# Patient Record
Sex: Female | Born: 1985 | Hispanic: Yes | Marital: Married | Smoking: Never smoker
Health system: Southern US, Community
[De-identification: ages and names within clinical notes are randomized; demographics above are authoritative.]

## PROBLEM LIST (undated history)

## (undated) DIAGNOSIS — Z789 Other specified health status: Secondary | ICD-10-CM

## (undated) HISTORY — DX: Other specified health status: Z78.9

## (undated) HISTORY — PX: APPENDECTOMY: SHX54

---

## 2012-06-09 LAB — OB RESULTS CONSOLE HEPATITIS B SURFACE ANTIGEN: Hepatitis B Surface Ag: NEGATIVE

## 2012-06-09 LAB — OB RESULTS CONSOLE HGB/HCT, BLOOD
HCT: 39 %
Hemoglobin: 13.5 g/dL

## 2012-06-09 LAB — OB RESULTS CONSOLE ABO/RH

## 2012-06-09 LAB — OB RESULTS CONSOLE GC/CHLAMYDIA
Chlamydia: NEGATIVE
Gonorrhea: NEGATIVE

## 2012-06-09 LAB — CYSTIC FIBROSIS DIAGNOSTIC STUDY: Interpretation-CFDNA:: NEGATIVE

## 2012-06-24 ENCOUNTER — Other Ambulatory Visit (HOSPITAL_COMMUNITY)
Admission: RE | Admit: 2012-06-24 | Discharge: 2012-06-24 | Disposition: A | Discharge status: Home / Self Care | Payer: Medicaid Other | Source: Ambulatory Visit | Attending: Obstetrics & Gynecology | Admitting: Obstetrics & Gynecology

## 2012-06-24 DIAGNOSIS — Z01419 Encounter for gynecological examination (general) (routine) without abnormal findings: Secondary | ICD-10-CM | POA: Insufficient documentation

## 2012-06-24 DIAGNOSIS — Z1151 Encounter for screening for human papillomavirus (HPV): Secondary | ICD-10-CM | POA: Insufficient documentation

## 2012-08-09 ENCOUNTER — Ambulatory Visit (INDEPENDENT_AMBULATORY_CARE_PROVIDER_SITE_OTHER): Payer: Self-pay | Admitting: Family Medicine

## 2012-08-09 ENCOUNTER — Encounter: Payer: Self-pay | Admitting: Family Medicine

## 2012-08-09 VITALS — BP 112/78 | Temp 97.8°F | Ht 62.0 in | Wt 127.6 lb

## 2012-08-09 DIAGNOSIS — Z3400 Encounter for supervision of normal first pregnancy, unspecified trimester: Secondary | ICD-10-CM

## 2012-08-09 DIAGNOSIS — Z23 Encounter for immunization: Secondary | ICD-10-CM

## 2012-08-09 LAB — POCT URINALYSIS DIP (DEVICE)
Bilirubin Urine: NEGATIVE
Nitrite: NEGATIVE
pH: 7 (ref 5.0–8.0)

## 2012-08-09 MED ORDER — INFLUENZA VIRUS VACC SPLIT PF IM SUSP
0.5000 mL | Freq: Once | INTRAMUSCULAR | Status: AC
  Administered 2012-08-09: 0.5 mL via INTRAMUSCULAR

## 2012-08-09 NOTE — Progress Notes (Signed)
Pulse- 102 Weight discussed of 25-35lbs New ob packet given Pt c/o right side leg pain at the thigh

## 2012-08-09 NOTE — Patient Instructions (Signed)
Embarazo - Segundo trimestre (Pregnancy - Second Trimester) El segundo trimestre del Psychiatrist (del 3 al ) es un perodo de evolucin rpida para usted y el beb. Hacia el final del sexto mes, el beb mide aproximadamente 23 cm y pesa 680 g. Comenzar a Pharmacologist del beb [place] 18 y las 20 [address] de [place]. Podr sentir las pataditas ("quickening en ingls"). Hay un rpido [employer]. Puede segregar un lquido claro Charity fundraiser) de las [place]. Quizs sienta pequeas contracciones en el vientre (tero) Esto se conoce como falso trabajo de parto o contracciones de Braxton-Hicks. Es como una prctica del trabajo de parto que se produce cuando el beb est listo para salir. Generalmente los problemas de vmitos matinales ya se han superado hacia el final del Medical sales representative. Algunas mujeres desarrollan pequeas manchas oscuras (que se denominan cloasma, mscara del embarazo) en la cara que normalmente se van luego del nacimiento del beb. La exposicin al sol empeora las manchas. Puede desarrollarse acn en algunas mujeres embarazadas, y puede desaparecer en aquellas que ya tienen acn. EXAMENES PRENATALES  Durante los Manpower Inc, deber seguir realizando pruebas de [place], segn avance el [place]. Estas pruebas se realizan para controlar su salud y la del beb. Tambin se realizan anlisis de sangre para [employer] Mutual niveles de [place]. La anemia (bajo nivel de hemoglobina) es frecuente durante el embarazo. Para prevenirla, se administran hierro y vitaminas. Tambin se le realizarn exmenes para saber si tiene diabetes entre las 24 y las 28 semanas del [place]. Podrn repetirle algunas de las Hovnanian Enterprises hicieron previamente.  En cada visita le medirn el tamao del tero. Esto se realiza para asegurarse de que el beb est creciendo correctamente de acuerdo al estado del [place].  Tambin en cada visita prenatal controlarn su presin arterial. Esto se realiza  para asegurarse de que no tenga toxemia.  Se controlar su orina para asegurarse de que no tenga infecciones, diabetes o protena en la orina.  Se controlar su peso regularmente para asegurarse que el aumento ocurre al ritmo indicado. Esto se hace para asegurarse que usted y el beb tienen una evolucin normal.  En algunas ocasiones se realiza una prueba de ultrasonido para confirmar el correcto desarrollo y evolucin del beb. Esta prueba se realiza con ondas sonoras inofensivas para el beb, de modo que el profesional pueda calcular ms precisamente la fecha del [place]. Algunas veces se realizan pruebas especializadas del lquido amnitico que rodea al beb. Esta prueba se denomina amniocentesis. El lquido amnitico se obtiene introduciendo una aguja en el vientre (abdomen). Se realiza para Conservator, museum/gallery en los que existe alguna preocupacin acerca de algn problema gentico que pueda sufrir el beb. En ocasiones se lleva a cabo cerca del final del embarazo, si es necesario inducir al Apple Computer. En este caso se realiza para asegurarse que los pulmones del beb estn lo suficientemente maduros como para que pueda vivir fuera del tero. CAMBIOS QUE OCURREN EN EL SEGUNDO TRIMESTRE DEL EMBARAZO Su organismo atravesar numerosos cambios durante el Big Lots. Estos pueden variar de Neomia Dear persona a otra. Converse con el profesional que la asiste acerca los cambios que usted note y que la preocupen.  Durante el segundo trimestre probablemente sienta un aumento del apetito. Es normal tener "antojos" de Development worker, community. Esto vara de Neomia Dear persona a otra y de un embarazo a Therapist, art.  El abdomen inferior comenzar a abultarse.  Podr tener la necesidad de Geographical information systems officer con ms frecuencia debido a que  el tero y el beb presionan sobre la vejiga. Tambin es frecuente contraer ms infecciones urinarias durante el embarazo (dolor al ConocoPhillips). Puede evitarlas bebiendo gran cantidad de lquidos y vaciando  la vejiga antes y despus de Sales promotion account executive.  Podrn aparecer las primeras estras en las caderas, abdomen y [place]. Estos son cambios normales del cuerpo durante el [place]. No existen medicamentos ni ejercicios que puedan prevenir CarMax.  Es posible que comience a desarrollar venas inflamadas y abultadas (varices) en las piernas. El uso de medias de descanso, Optometrist sus pies durante 15 minutos, 3 a 4 veces al da y Film/video editor la sal en su dieta ayuda a Journalist, newspaper.  Podr sentir Engineering geologist gstrica a medida que el tero crece y Doctor, general practice. Puede tomar anticidos, con la autorizacin de su mdico, para Financial planner. Tambin es til ingerir pequeas comidas 4 a 5 veces al Futures trader.  La constipacin puede tratarse con un laxante o agregando fibra a su dieta. Beber grandes cantidades de lquidos, comer vegetales, frutas y granos integrales es de [place].  Tambin es beneficioso practicar actividad fsica. Si ha sido una persona Engineer, mining, podr continuar con la Harley-Davidson de las actividades durante el mismo. Si ha sido [employer] Family [employer], puede ser beneficioso que comience con un programa de ejercicios, Museum/gallery exhibitions officer.  Puede desarrollar hemorroides (vrices en el recto) hacia el final del segundo trimestre. Tomar baos de asiento tibios y Chemical engineer cremas recomendadas por el profesional que lo asiste sern de ayuda para los problemas de hemorroides.  Tambin podr Financial risk analyst de espalda durante este momento de su embarazo. Evite levantar objetos pesados, utilice zapatos de taco bajo y [place] buena postura para ayudar a reducir los problemas de [place].  Algunas mujeres embarazadas desarrollan hormigueo y adormecimiento de la mano y los dedos debido a la hinchazn y compresin de los ligamentos de la mueca (sndrome del tnel carpiano). Esto desaparece una vez que el beb nace.  Como sus pechos se agrandan, Pension scheme manager un sujetador  ms grande. Use un sostn de soporte, cmodo y de algodn. No utilice un sostn para amamantar hasta el ltimo mes de embarazo si va a amamantar al beb.  Podr observar una lnea oscura desde el ombligo hacia la zona pbica denominada linea nigra.  Podr observar que sus mejillas se ponen coloradas debido al aumento de flujo sanguneo en la cara.  Podr desarrollar "araitas" en la cara, cuello y pecho. Esto desaparece una vez que el beb nace. INSTRUCCIONES PARA EL CUIDADO DOMICILIARIO  Es extremadamente importante que evite el cigarrillo, hierbas medicinales, alcohol y las drogas no prescriptas durante el Psychiatrist. Estas sustancias qumicas afectan la formacin y el desarrollo del beb. Evite estas sustancias durante todo el embarazo para asegurar el nacimiento de un beb sano.  La mayor parte de los cuidados que se aconsejan son los mismos que los indicados para Financial risk analyst trimestre del Psychiatrist. Cumpla con las citas tal como se le indic. Siga las instrucciones del profesional que lo asiste con respecto al uso de los medicamentos, el ejercicio y Psychologist, forensic.  Durante el embarazo debe obtener nutrientes para usted y para su beb. Consuma alimentos balanceados a intervalos regulares. Elija alimentos como carne, pescado, [place] y otros productos lcteos descremados, vegetales, frutas, panes integrales y cereales. El Equities trader cul es el aumento de peso ideal.  Las relaciones sexuales fsicas pueden continuarse hasta cerca del fin del embarazo si no existen otros problemas. Estos  problemas pueden ser la prdida temprana (prematura) de lquido amnitico de las [place], sangrado vaginal, dolor abdominal u otros problemas mdicos o del Psychiatrist.  Realice Tesoro Corporation, si no tiene restricciones. Consulte con el profesional que la asiste si no sabe con certeza si determinados ejercicios son seguros. El mayor aumento de peso tiene Environmental consultant durante los ltimos 2 trimestres del  Psychiatrist. El ejercicio la ayudar a:  Engineering geologist.  Ponerla en forma para el parto.  Ayudarla a perder peso luego de haber dado a luz.  Use un buen sostn o como los que se usan para hacer deportes para Paramedic la sensibilidad de las [place]. Tambin puede serle til si lo [place] mientras duerme. Si pierde Product manager, podr Parker Hannifin.  No utilice la baera con agua caliente, baos turcos y saunas durante el [address]  Utilice el cinturn de seguridad sin excepcin cuando conduzca. Este la proteger a usted y al beb en caso de accidente.  Evite comer carne cruda, queso crudo, y el contacto con los utensilios y desperdicios de los gatos. Estos elementos contienen grmenes que pueden causar defectos de nacimiento en el beb.  El segundo trimestre es un buen momento para visitar a su dentista y Software engineer si an no lo ha hecho. Es Primary school teacher los dientes limpios. Utilice un cepillo de dientes blando. Cepllese ms suavemente durante el embarazo.  Es ms fcil perder algo de orina durante el [place]. Apretar y Chief Operating Officer los msculos de la pelvis la ayudar con este problema. Practique detener la miccin cuando est en el bao. Estos son los mismos msculos que Development worker, international aid. Son TEPPCO Partners mismos msculos que utiliza cuando trata de Ryder System gases. Puede practicar apretando estos msculos 10 veces, y repetir esto 3 veces por da aproximadamente. Una vez que conozca qu msculos debe apretar, no realice estos ejercicios durante la miccin. Puede favorecerle una infeccin si la orina vuelve hacia atrs.  Pida ayuda si tiene necesidades econmicas, de asesoramiento o nutricionales durante el [place]. El profesional podr ayudarla con respecto a estas necesidades, o derivarla a otros especialistas.  La piel puede ponerse grasa. Si esto sucede, lvese la cara con un jabn [place], utilice un humectante no graso y [place] con base de aceite o  crema. CONSUMO DE MEDICAMENTOS Y DROGAS DURANTE EL EMBARAZO  Contine tomando las vitaminas apropiadas para esta etapa tal como se le indic. Las vitaminas deben contener un miligramo de cido flico y deben suplementarse con hierro. Guarde todas las vitaminas fuera del alcance de los nios. La ingestin de slo un par de vitaminas o tabletas que contengan hierro puede ocasionar la Newmont Mining en un beb o en un nio pequeo.  Evite el uso de [place], inclusive los de venta libre y hierbas que no hayan sido prescriptos o indicados por el profesional que la asiste. Algunos medicamentos pueden causar problemas fsicos al beb. Utilice los medicamentos de venta libre o de prescripcin para Chief Technology Officer, Environmental health practitioner o la [place], segn se lo indique el profesional que lo asiste. No utilice aspirina.  El consumo de alcohol est relacionado con ciertos defectos de nacimiento. Esto incluye el sndrome de alcoholismo fetal. Debe evitar el consumo de alcohol en cualquiera de sus formas. El cigarrillo causa nacimientos prematuros y bebs de [place]. El uso de drogas recreativas est absolutamente prohibido. Son muy nocivas para el beb. Un beb que nace de American Express, ser adicto al nacer. Ese beb tendr los mismos  sntomas de abstinencia que un adulto.  Infrmele al profesional si consume alguna droga.  No consuma drogas ilegales. Pueden causarle mucho dao al beb. SOLICITE ATENCIN MDICA SI: Tiene preguntas o preocupaciones durante su embarazo. Es mejor que llame para Science writer las dudas que esperar hasta su prxima visita prenatal. Thressa Sheller forma se sentir ms tranquila.  SOLICITE ATENCIN MDICA DE INMEDIATO SI:  La temperatura oral se eleva sin motivo por encima de 102 F (38.9 C) o segn le indique el profesional que lo asiste.  Tiene una prdida de lquido por la vagina (canal de parto). Si sospecha una ruptura de las [place], tmese la temperatura y llame al profesional para informarlo sobre  esto.  Observa unas pequeas manchas, una hemorragia vaginal o elimina cogulos. Notifique al profesional acerca de la cantidad y de cuntos apsitos est utilizando. Unas pequeas manchas de sangre son algo comn durante el Psychiatrist, especialmente despus de Sales promotion account executive.  Presenta un olor desagradable en la secrecin vaginal y observa un cambio en el color, de transparente a blanco.  Contina con las nuseas y no obtiene alivio de los remedios indicados. Vomita sangre o algo similar a la borra del caf.  [place] o sube ms de 900 g. en una semana, o segn lo indicado por el profesional que la asiste.  Observa que se le Southwest Airlines, las manos, los pies o las piernas.  Ha estado expuesta a la rubola y no ha sufrido la enfermedad.  Ha estado expuesta a la quinta enfermedad o a la varicela.  Presenta dolor abdominal. Las molestias en el ligamento redondo son Neomia Dear causa no cancerosa (benigna) frecuente de dolor abdominal durante el embarazo. El profesional que la asiste deber evaluarla.  Presenta dolor de cabeza intenso que no se [place].  Presenta fiebre, diarrea, dolor al orinar o le falta la respiracin.  Presenta dificultad para ver, visin borrosa, o visin doble.  Sufre una cada, un accidente de trnsito o cualquier tipo de trauma.  Vive en un hogar en el que existe violencia fsica o mental. Document Released: 05/07/2005 Document Revised: 10/20/2011 [facility] Patient Information 2013 [place], [place].  Lactancia materna  (Breastfeeding) Decidir Museum/gallery exhibitions officer es una de las mejores elecciones que puede hacer por usted y su beb. La informacin que se brinda a Psychologist, clinical dar una breve visin de los beneficios de la lactancia materna as como de las dudas ms frecuentes alrededor de ella.  LOS BENEFICIOS DE AMAMANTAR  Para el beb   La primera leche (calostro ) ayuda al mejor funcionamiento del sistema digestivo del beb.   La leche tiene anticuerpos que  provienen de la madre y que ayudan a prevenir las infecciones en el beb.   El beb tiene una menor incidencia de asma, alergias y del sndrome de muerte sbita del lactante (SMSL).   Los nutrientes en la [place] materna son mejores para el beb que los preparados para lactantes y la [place] materna ayuda a un mejor desarrollo del cerebro del beb.   Los bebs amamantados tienen menos gases, clicos y estreimiento.  Para la mam   La lactancia materna favorece el desarrollo de un vnculo muy especial entre la madre y el beb.   Es ms conveniente, siempre disponible y a Landscape architect y Film/video editor.   Consume caloras en la madre y la ayuda a perder el peso ganado durante el [place].   Favorece la contraccin del tero a su tamao normal, de manera ms rpida y [employer]away las hemorragias luego del  parto.   Las [employer].C. Holdings que amamantan tienen menor riesgo de Geophysical data processor de mama.  FRECUENCIA DEL AMAMANTAMIENTO   Un beb sano, nacido a trmino, puede amamantarse con tanta frecuencia como cada hora, o espaciar las comidas cada tres horas.   Observe al beb cuando manifieste signos de hambre. Amamante a su beb si muestra signos de hambre. Esta frecuencia variar de un beb a otro.   Amamntelo tan seguido como el beb lo solicite, o cuando usted sienta la necesidad de Paramedic sus [place].   Despierte al beb si han pasado 3  4 horas desde la ltima comida.   El amamantamiento frecuente la ayudar a producir ms [place] y a Education officer, community de Engineer, mining en los pezones e hinchazn de las [place].  POSICIN DEL BEBE PARA EL AMAMANTAMIENTO   Ya sea que se encuentre [place] o sentada, asegrese que el abdomen del beb enfrente el suyo.   Sostenga la mama con el pulgar por arriba y los otros 4 dedos por debajo. Asegrese que sus dedos se encuentren lejos del pezn y de la boca del beb.   Empuje suavemente los labios del beb con el pezn o con el dedo.   Cuando la boca  del beb se abra lo suficiente, introduzca el pezn y la areola tanto como le sea posible dentro de la boca.   Coloque al beb cerca suyo de modo que su nariz y mejillas toquen las mamas al Texas [employer].  ALIMENTACIN Y SUCCIN   La duracin de cada comida vara de un beb a otro y de Neomia Dear comida a [place].   El beb debe succionar entre 2 y 3 minutos para que Development worker, international aid. Esto se denomina "bajada". Por este motivo, permita que el nio se alimente en cada mama todo lo que desee. Terminar de mamar cuando haya recibido la cantidad [place] de nutrientes.   Para detener la succin coloque su dedo en la comisura de la boca del nio y Midwife entre sus encas antes de quitarle la mama de la boca. Esto la ayudar a English as a second language teacher.  COMO SABER SI EL BEB OBTIENE LA SUFICIENTE LECHE MATERNA  Preguntarse si el beb obtiene la cantidad suficiente de [place] es una preocupacin frecuente Lucent Technologies. Puede asegurarse que el beb tiene la leche suficiente si:   El beb succiona activamente y usted escucha que traga .   El beb parece estar relajado y satisfecho despus de Psychologist, clinical.   El nio se alimenta al menos 8 a 12 veces en 24 horas. Alimntelo hasta que se desprenda por sus propios medios o se quede dormido en la primera mama (al menos durante 10 a 20 minutos), luego ofrzcale el otro lado.   El beb moja 5 a 6 paales desechables (6 a 8 paales de tela) en 24 horas cuando tiene 5  6 das de vida.   Tiene al Lowe's Companies 3 a 4 deposiciones todos los Becton, Dickinson and Company primeros meses. La materia fecal debe ser blanda y [place].   El beb debe aumentar 4 a 6 libras (120 a 170 gr.) por semana despus de los 4 [address] de vida.   Siente que las mamas se ablandan despus de amamantar  REDUCIR LA CONGESTIN DE LAS MAMAS   Durante la primera semana despus del [place], usted puede experimentar hinchazn en las mamas. Cuando las mamas estn congestionadas, se sienten calientes, llenas y  molestas al tacto. Puede reducir la congestin si:   Lo amamanta frecuentemente, cada 2-3  horas. Las mams que [employer] Corporation pronto y con frecuencia tienen menos problemas de [place].   Coloque compresas de hielo en sus mamas durante 10-20 minutos entre cada amamantamiento. Esto ayuda a Building services engineer. Envuelva las bolsas de hielo en una toalla liviana para proteger su piel. Las bolsas de vegetales congelados funcionan bien para este propsito.   Tome una ducha tibia o aplique compresas hmedas calientes en las mamas durante 5 a 10 minutos antes de cada vez que [place]. Esto aumenta la circulacin y [place] a que la [place].   Masajee suavemente la mama antes y Psychologist, sport and exercise. Con las puntas de los dedos, masajee desde la pared torcica hacia abajo hasta llegar al pezn, con movimientos circulares.   Asegrese que el nio vaca al menos una mama antes de cambiar de lado.   Use un sacaleche para vaciar la mama si el beb se duerme o no se alimenta bien. Tambin podr Phelps Dodge con esa bomba si tiene que volver al trabajo o siente que las mamas estn congestionadas.   Evite los biberones, chupetes o complementar la alimentacin con agua o jugos en lugar de la [place]. La leche materna es todo el alimento que el beb necesita. No es necesario que el nio ingiera agua o preparados de bibern. Louann Liv, es lo mejor para ayudar a que las mamas produzcan ms [place]. no darle suplementos al Bank of America las primeras semanas.   Verifique que el beb se encuentra en la posicin correcta mientras lo alimenta.   Use un sostn que soporte bien sus mamas y State Street Corporation que tienen aro.   Consuma una dieta balanceada y beba lquidos en cantidad.   Descanse con frecuencia, reljese y tome sus vitaminas prenatales para evitar la fatiga, el estrs y la anemia.  Si sigue estas indicaciones, la congestin debe mejorar en 24 a 48 horas. Si an tiene dificultades, consulte a Nurse, adult.  CUDESE USTED MISMA  Cuide sus mamas.   Bese o dchese diariamente.   Evite usar Eaton Corporation.   Comience a amamantar del lado izquierdo en una comida y del lado derecho en la siguiente.   Notar que aumenta el flujo de [place] a los 2 a 5 [address] despus del [address]. Puede sentir algunas molestias por la congestin, lo que hace que sus mamas estn duras y sensibles. La congestin disminuye en 24 a 48 horas. Mientras tanto, aplique toallas hmedas calientes durante 5 a 10 minutos antes de amamantar. Un masaje suave y la extraccin de un poco de leche antes de Museum/gallery exhibitions officer ablandarn las mamas y har ms fcil que el beb se agarre.   Use un buen sostn y seque al aire los pezones durante 3 a 4 minutos luego de Wellsite geologist.   Solo utilice apsitos de algodn.   Utilice lanolina W[employer] International pezones luego de [place]. No necesita lavarlos luego de alimentar al McGraw[employer]Hill. Otra opcin es exprimir algunas gotas de [place] y [employer] Holdings pezones.  Cumpla con estos cuidados   Consuma alimentos bien balanceados y refrigerios nutritivos.   Dixie Dials, jugos de fruta y agua para Warehouse manager sed (alrededor de 8 vasos por Futures trader).   Descanse lo suficiente.  Evite los alimentos que usted note que pueden afectar al beb.  SOLICITE ATENCIN MDICA SI:   Tiene dificultad con la lactancia materna y [place].   Tiene una zona de color rojo, dura y dolorosa en la mama que se acompaa  de fiebre.   El beb est muy somnoliento como para alimentarse bien o tiene problemas para dormir.   Su beb moja menos de 6 paales al [address], a los 5 [address]  [address] de vida.   La piel del beb o la parte blanca de sus ojos est ms amarilla de lo que estaba en el hospital.   Se siente deprimida.  Document Released: 07/28/2005 Document Revised: 01/27/2012 [facility] Patient Information 2013 [place], [place].

## 2012-08-09 NOTE — Progress Notes (Signed)
New OB transfer from [facility] secondary to money. Needs integrated screen part II-done and WNL-nml NT Anatomy-will send to Dr. Elsie Stain office.

## 2012-08-09 NOTE — Progress Notes (Signed)
Nutrition note: 1st visit consult Pt has gained 14.6# @ [redacted]w[redacted]d, which is slightly > expected. Pt reports eating 3 meals & 1-2 snacks/d. Pt reports drinking water, milk & juice daily and coke occ. Pt is taking PNV. Pt reports no more N&V but does have heartburn. NKFA. Pt reports walking 3-4x/wk. Pt received verbal & written education on general nutrition during pregnancy & tips to decrease heartburn. Disc benefits of exclusive BF. Disc wt gain goals of 25-35# or 1#/wk. Pt agrees to continue PNV. Pt receives WIC in [place]. Pt plans to BF. F/u if referred Blondell Reveal, MS, RD, LDN

## 2012-08-11 NOTE — L&D Delivery Note (Signed)
Jema Vannesa Abair is a 27 y.o. [id number] presenting at [redacted]w[redacted]d with PPROM.   Delivery Note At 3:58 AM a viable female was delivered via Vaginal, Spontaneous Delivery (Presentation: Left Occiput Anterior).  APGAR: 7, 9; weight 7 lb 7.6 oz (3391 g).   Placenta status: Intact, Spontaneous.  Cord: 3 vessels with the following complications: None.  Cord pH: n/a  Anesthesia: None  Episiotomy: None Lacerations: None Suture Repair: n/a Est. Blood Loss (mL): 300  Mom to postpartum.  Baby to nursery-stable.  Napoleon Form 12/14/2012, 4:46 AM

## 2012-08-12 ENCOUNTER — Encounter: Payer: Self-pay | Admitting: *Deleted

## 2012-08-16 ENCOUNTER — Ambulatory Visit (HOSPITAL_COMMUNITY)
Admission: RE | Admit: 2012-08-16 | Discharge: 2012-08-16 | Disposition: A | Discharge status: Home / Self Care | Payer: Self-pay | Source: Ambulatory Visit | Attending: Family Medicine | Admitting: Family Medicine

## 2012-08-16 ENCOUNTER — Encounter: Payer: Self-pay | Admitting: *Deleted

## 2012-08-16 DIAGNOSIS — Z3400 Encounter for supervision of normal first pregnancy, unspecified trimester: Secondary | ICD-10-CM

## 2012-08-16 DIAGNOSIS — O358XX0 Maternal care for other (suspected) fetal abnormality and damage, not applicable or unspecified: Secondary | ICD-10-CM | POA: Insufficient documentation

## 2012-08-16 DIAGNOSIS — Z363 Encounter for antenatal screening for malformations: Secondary | ICD-10-CM | POA: Insufficient documentation

## 2012-08-16 DIAGNOSIS — Z1389 Encounter for screening for other disorder: Secondary | ICD-10-CM | POA: Insufficient documentation

## 2012-08-17 ENCOUNTER — Encounter: Payer: Self-pay | Admitting: Family Medicine

## 2012-08-17 ENCOUNTER — Encounter: Payer: Self-pay | Admitting: *Deleted

## 2012-09-08 ENCOUNTER — Ambulatory Visit (INDEPENDENT_AMBULATORY_CARE_PROVIDER_SITE_OTHER): Payer: Self-pay | Admitting: Family Medicine

## 2012-09-08 VITALS — BP 112/70 | Temp 97.1°F | Wt 137.8 lb

## 2012-09-08 DIAGNOSIS — Z3400 Encounter for supervision of normal first pregnancy, unspecified trimester: Secondary | ICD-10-CM

## 2012-09-08 LAB — POCT URINALYSIS DIP (DEVICE)
Bilirubin Urine: NEGATIVE
Glucose, UA: NEGATIVE mg/dL
Hgb urine dipstick: NEGATIVE
Nitrite: NEGATIVE
Specific Gravity, Urine: 1.02 (ref 1.005–1.030)
Urobilinogen, UA: 0.2 mg/dL (ref 0.0–1.0)

## 2012-09-08 NOTE — Progress Notes (Signed)
Concerned about poor weight gain previously, but has gained 10 lb since last visit. Discussed appropriate diet/nutrition and weight gain.

## 2012-09-08 NOTE — Patient Instructions (Signed)
Embarazo - Segundo trimestre (Pregnancy - Second Trimester) El segundo trimestre del embarazo (del 3 al 6mes) es un perodo de evolucin rpida para usted y el beb. Hacia el final del sexto mes, el beb mide aproximadamente 23 cm y pesa 680 g. Comenzar a sentir los movimientos del beb entre las 18 y las 20 semanas de embarazo. Podr sentir las pataditas ("quickening en ingls"). Hay un rpido aumento de peso. Puede segregar un lquido claro (calostro) de las mamas. Quizs sienta pequeas contracciones en el vientre (tero) Esto se conoce como falso trabajo de parto o contracciones de Braxton-Hicks. Es como una prctica del trabajo de parto que se produce cuando el beb est listo para salir. Generalmente los problemas de vmitos matinales ya se han superado hacia el final del primer trimestre. Algunas mujeres desarrollan pequeas manchas oscuras (que se denominan cloasma, mscara del embarazo) en la cara que normalmente se van luego del nacimiento del beb. La exposicin al sol empeora las manchas. Puede desarrollarse acn en algunas mujeres embarazadas, y puede desaparecer en aquellas que ya tienen acn. EXAMENES PRENATALES  Durante los exmenes prenatales, deber seguir realizando pruebas de sangre, segn avance el embarazo. Estas pruebas se realizan para controlar su salud y la del beb. Tambin se realizan anlisis de sangre para conocer los niveles de hemoglobina. La anemia (bajo nivel de hemoglobina) es frecuente durante el embarazo. Para prevenirla, se administran hierro y vitaminas. Tambin se le realizarn exmenes para saber si tiene diabetes entre las 24 y las 28 semanas del embarazo. Podrn repetirle algunas de las pruebas que le hicieron previamente.  En cada visita le medirn el tamao del tero. Esto se realiza para asegurarse de que el beb est creciendo correctamente de acuerdo al estado del embarazo.  Tambin en cada visita prenatal controlarn su presin arterial. Esto se realiza  para asegurarse de que no tenga toxemia.  Se controlar su orina para asegurarse de que no tenga infecciones, diabetes o protena en la orina.  Se controlar su peso regularmente para asegurarse que el aumento ocurre al ritmo indicado. Esto se hace para asegurarse que usted y el beb tienen una evolucin normal.  En algunas ocasiones se realiza una prueba de ultrasonido para confirmar el correcto desarrollo y evolucin del beb. Esta prueba se realiza con ondas sonoras inofensivas para el beb, de modo que el profesional pueda calcular ms precisamente la fecha del parto. Algunas veces se realizan pruebas especializadas del lquido amnitico que rodea al beb. Esta prueba se denomina amniocentesis. El lquido amnitico se obtiene introduciendo una aguja en el vientre (abdomen). Se realiza para controlar los cromosomas en aquellos casos en los que existe alguna preocupacin acerca de algn problema gentico que pueda sufrir el beb. En ocasiones se lleva a cabo cerca del final del embarazo, si es necesario inducir al parto. En este caso se realiza para asegurarse que los pulmones del beb estn lo suficientemente maduros como para que pueda vivir fuera del tero. CAMBIOS QUE OCURREN EN EL SEGUNDO TRIMESTRE DEL EMBARAZO Su organismo atravesar numerosos cambios durante el embarazo. Estos pueden variar de una persona a otra. Converse con el profesional que la asiste acerca los cambios que usted note y que la preocupen.  Durante el segundo trimestre probablemente sienta un aumento del apetito. Es normal tener "antojos" de ciertas comidas. Esto vara de una persona a otra y de un embarazo a otro.  El abdomen inferior comenzar a abultarse.  Podr tener la necesidad de orinar con ms frecuencia debido a que   el tero y el beb presionan sobre la vejiga. Tambin es frecuente contraer ms infecciones urinarias durante el embarazo (dolor al orinar). Puede evitarlas bebiendo gran cantidad de lquidos y vaciando  la vejiga antes y despus de mantener relaciones sexuales.  Podrn aparecer las primeras estras en las caderas, abdomen y mamas. Estos son cambios normales del cuerpo durante el embarazo. No existen medicamentos ni ejercicios que puedan prevenir estos cambios.  Es posible que comience a desarrollar venas inflamadas y abultadas (varices) en las piernas. El uso de medias de descanso, elevar sus pies durante 15 minutos, 3 a 4 veces al da y limitar la sal en su dieta ayuda a aliviar el problema.  Podr sentir acidez gstrica a medida que el tero crece y presiona contra el estmago. Puede tomar anticidos, con la autorizacin de su mdico, para aliviar este problema. Tambin es til ingerir pequeas comidas 4 a 5 veces al da.  La constipacin puede tratarse con un laxante o agregando fibra a su dieta. Beber grandes cantidades de lquidos, comer vegetales, frutas y granos integrales es de gran ayuda.  Tambin es beneficioso practicar actividad fsica. Si ha sido una persona activa hasta el embarazo, podr continuar con la mayora de las actividades durante el mismo. Si ha sido menos activa, puede ser beneficioso que comience con un programa de ejercicios, como realizar caminatas.  Puede desarrollar hemorroides (vrices en el recto) hacia el final del segundo trimestre. Tomar baos de asiento tibios y utilizar cremas recomendadas por el profesional que lo asiste sern de ayuda para los problemas de hemorroides.  Tambin podr sentir dolor de espalda durante este momento de su embarazo. Evite levantar objetos pesados, utilice zapatos de taco bajo y mantenga una buena postura para ayudar a reducir los problemas de espalda.  Algunas mujeres embarazadas desarrollan hormigueo y adormecimiento de la mano y los dedos debido a la hinchazn y compresin de los ligamentos de la mueca (sndrome del tnel carpiano). Esto desaparece una vez que el beb nace.  Como sus pechos se agrandan, necesitar un sujetador  ms grande. Use un sostn de soporte, cmodo y de algodn. No utilice un sostn para amamantar hasta el ltimo mes de embarazo si va a amamantar al beb.  Podr observar una lnea oscura desde el ombligo hacia la zona pbica denominada linea nigra.  Podr observar que sus mejillas se ponen coloradas debido al aumento de flujo sanguneo en la cara.  Podr desarrollar "araitas" en la cara, cuello y pecho. Esto desaparece una vez que el beb nace. INSTRUCCIONES PARA EL CUIDADO DOMICILIARIO  Es extremadamente importante que evite el cigarrillo, hierbas medicinales, alcohol y las drogas no prescriptas durante el embarazo. Estas sustancias qumicas afectan la formacin y el desarrollo del beb. Evite estas sustancias durante todo el embarazo para asegurar el nacimiento de un beb sano.  La mayor parte de los cuidados que se aconsejan son los mismos que los indicados para el primer trimestre del embarazo. Cumpla con las citas tal como se le indic. Siga las instrucciones del profesional que lo asiste con respecto al uso de los medicamentos, el ejercicio y la dieta.  Durante el embarazo debe obtener nutrientes para usted y para su beb. Consuma alimentos balanceados a intervalos regulares. Elija alimentos como carne, pescado, leche y otros productos lcteos descremados, vegetales, frutas, panes integrales y cereales. El profesional le informar cul es el aumento de peso ideal.  Las relaciones sexuales fsicas pueden continuarse hasta cerca del fin del embarazo si no existen otros problemas. Estos   problemas pueden ser la prdida temprana (prematura) de lquido amnitico de las membranas, sangrado vaginal, dolor abdominal u otros problemas mdicos o del embarazo.  Realice actividad fsica todos los das, si no tiene restricciones. Consulte con el profesional que la asiste si no sabe con certeza si determinados ejercicios son seguros. El mayor aumento de peso tiene lugar durante los ltimos 2 trimestres del  embarazo. El ejercicio la ayudar a:  Controlar su peso.  Ponerla en forma para el parto.  Ayudarla a perder peso luego de haber dado a luz.  Use un buen sostn o como los que se usan para hacer deportes para aliviar la sensibilidad de las mamas. Tambin puede serle til si lo usa mientras duerme. Si pierde calostro, podr utilizar apsitos en el sostn.  No utilice la baera con agua caliente, baos turcos y saunas durante el embarazo.  Utilice el cinturn de seguridad sin excepcin cuando conduzca. Este la proteger a usted y al beb en caso de accidente.  Evite comer carne cruda, queso crudo, y el contacto con los utensilios y desperdicios de los gatos. Estos elementos contienen grmenes que pueden causar defectos de nacimiento en el beb.  El segundo trimestre es un buen momento para visitar a su dentista y evaluar su salud dental si an no lo ha hecho. Es importante mantener los dientes limpios. Utilice un cepillo de dientes blando. Cepllese ms suavemente durante el embarazo.  Es ms fcil perder algo de orina durante el embarazo. Apretar y fortalecer los msculos de la pelvis la ayudar con este problema. Practique detener la miccin cuando est en el bao. Estos son los mismos msculos que necesita fortalecer. Son tambin los mismos msculos que utiliza cuando trata de evitar los gases. Puede practicar apretando estos msculos 10 veces, y repetir esto 3 veces por da aproximadamente. Una vez que conozca qu msculos debe apretar, no realice estos ejercicios durante la miccin. Puede favorecerle una infeccin si la orina vuelve hacia atrs.  Pida ayuda si tiene necesidades econmicas, de asesoramiento o nutricionales durante el embarazo. El profesional podr ayudarla con respecto a estas necesidades, o derivarla a otros especialistas.  La piel puede ponerse grasa. Si esto sucede, lvese la cara con un jabn suave, utilice un humectante no graso y maquillaje con base de aceite o  crema. CONSUMO DE MEDICAMENTOS Y DROGAS DURANTE EL EMBARAZO  Contine tomando las vitaminas apropiadas para esta etapa tal como se le indic. Las vitaminas deben contener un miligramo de cido flico y deben suplementarse con hierro. Guarde todas las vitaminas fuera del alcance de los nios. La ingestin de slo un par de vitaminas o tabletas que contengan hierro puede ocasionar la muerte en un beb o en un nio pequeo.  Evite el uso de medicamentos, inclusive los de venta libre y hierbas que no hayan sido prescriptos o indicados por el profesional que la asiste. Algunos medicamentos pueden causar problemas fsicos al beb. Utilice los medicamentos de venta libre o de prescripcin para el dolor, el malestar o la fiebre, segn se lo indique el profesional que lo asiste. No utilice aspirina.  El consumo de alcohol est relacionado con ciertos defectos de nacimiento. Esto incluye el sndrome de alcoholismo fetal. Debe evitar el consumo de alcohol en cualquiera de sus formas. El cigarrillo causa nacimientos prematuros y bebs de bajo peso. El uso de drogas recreativas est absolutamente prohibido. Son muy nocivas para el beb. Un beb que nace de una madre adicta, ser adicto al nacer. Ese beb tendr los mismos   sntomas de abstinencia que un adulto.  Infrmele al profesional si consume alguna droga.  No consuma drogas ilegales. Pueden causarle mucho dao al beb. SOLICITE ATENCIN MDICA SI: Tiene preguntas o preocupaciones durante su embarazo. Es mejor que llame para consultar las dudas que esperar hasta su prxima visita prenatal. De esta forma se sentir ms tranquila.  SOLICITE ATENCIN MDICA DE INMEDIATO SI:  La temperatura oral se eleva sin motivo por encima de 102 F (38.9 C) o segn le indique el profesional que lo asiste.  Tiene una prdida de lquido por la vagina (canal de parto). Si sospecha una ruptura de las membranas, tmese la temperatura y llame al profesional para informarlo sobre  esto.  Observa unas pequeas manchas, una hemorragia vaginal o elimina cogulos. Notifique al profesional acerca de la cantidad y de cuntos apsitos est utilizando. Unas pequeas manchas de sangre son algo comn durante el embarazo, especialmente despus de mantener relaciones sexuales.  Presenta un olor desagradable en la secrecin vaginal y observa un cambio en el color, de transparente a blanco.  Contina con las nuseas y no obtiene alivio de los remedios indicados. Vomita sangre o algo similar a la borra del caf.  [place] o sube ms de 900 g. en una semana, o segn lo indicado por el profesional que la asiste.  Observa que se le hinchan el rostro, las manos, los pies o las piernas.  Ha estado expuesta a la rubola y no ha sufrido la enfermedad.  Ha estado expuesta a la quinta enfermedad o a la varicela.  Presenta dolor abdominal. Las molestias en el ligamento redondo son una causa no cancerosa (benigna) frecuente de dolor abdominal durante el embarazo. El profesional que la asiste deber evaluarla.  Presenta dolor de cabeza intenso que no se alivia.  Presenta fiebre, diarrea, dolor al orinar o le falta la respiracin.  Presenta dificultad para ver, visin borrosa, o visin doble.  Sufre una cada, un accidente de trnsito o cualquier tipo de trauma.  Vive en un hogar en el que existe violencia fsica o mental. Document Released: 05/07/2005 Document Revised: 10/20/2011 [employer] Patient Information 2013 [employer].  

## 2012-09-08 NOTE — Progress Notes (Signed)
Pulse: 82

## 2012-10-06 ENCOUNTER — Other Ambulatory Visit: Payer: Self-pay | Admitting: Family Medicine

## 2012-10-06 ENCOUNTER — Encounter: Payer: Self-pay | Admitting: Internal Medicine

## 2012-10-06 ENCOUNTER — Encounter: Payer: Self-pay | Admitting: *Deleted

## 2012-10-06 ENCOUNTER — Ambulatory Visit (INDEPENDENT_AMBULATORY_CARE_PROVIDER_SITE_OTHER): Payer: Self-pay | Admitting: Family Medicine

## 2012-10-06 ENCOUNTER — Encounter: Payer: Self-pay | Admitting: Advanced Practice Midwife

## 2012-10-06 VITALS — BP 115/70 | Wt 145.1 lb

## 2012-10-06 DIAGNOSIS — Z3400 Encounter for supervision of normal first pregnancy, unspecified trimester: Secondary | ICD-10-CM

## 2012-10-06 LAB — POCT URINALYSIS DIP (DEVICE)
Glucose, UA: NEGATIVE mg/dL
Nitrite: NEGATIVE
Protein, ur: NEGATIVE mg/dL
Urobilinogen, UA: 0.2 mg/dL (ref 0.0–1.0)

## 2012-10-06 LAB — CBC
Hemoglobin: 11.9 g/dL — ABNORMAL LOW (ref 12.0–15.0)
Platelets: 248 10*3/uL (ref 150–400)
RBC: 4 MIL/uL (ref 3.87–5.11)
WBC: 8.1 10*3/uL (ref 4.0–10.5)

## 2012-10-06 LAB — RPR

## 2012-10-06 NOTE — Patient Instructions (Signed)
Embarazo  Tercer trimestre  (Pregnancy - Third Trimester) El tercer trimestre del embarazo (los ltimos 3 meses) es el perodo en el cual tanto usted como su beb crecen con ms rapidez. El beb alcanza un largo de aproximadamente 50 cm. y pesa entre 2,700 y 4,500 kg. El beb gana ms tejido graso y est listo para la vida fuera del cuerpo de la madre. Mientras estn en el interior, los bebs tienen perodos de sueo y vigilia, succionan el pulgar y tienen hipo. Quizs sienta pequeas contracciones del tero. Este es el falso trabajo de parto. Tambin se las conoce como contracciones de Braxton-Hicks . Es como una prctica del parto. Los problemas ms habituales de esta etapa del embarazo incluyen mayor dificultad para respirar, hinchazn de las manos y los pies por retencin de lquidos y la necesidad de orinar con ms frecuencia debido a que el tero y el beb presionan sobre la vejiga.  EXAMENES PRENATALES   Durante los exmenes prenatales, deber seguir realizndose anlisis de sangre. Estas pruebas se realizan para controlar su salud y la del beb. Los anlisis de sangre se realizan para conocer los niveles de algunos compuestos de la sangre (hemoglobina). La anemia (bajo nivel de hemoglobina) es frecuente durante el embarazo. Para prevenirla, se administran hierro y vitaminas. Tambin le tomarn nuevas anlisis para descartar diabetes. Podrn repetirle algunas de las pruebas que le hicieron previamente.  En cada visita le medirn el tamao del tero. Esto permite asegurar que el beb se desarrolla adecuadamente, segn la fecha del embarazo.  Le controlarn la presin arterial en cada visita prenatal. Esto es para asegurarse de que no sufre toxemia.  Le harn un anlisis de orina en cada visita prenatal, para descartar infecciones, diabetes y la presencia de protenas.  Tambin en cada visita controlarn su peso. Esto se realiza para asegurarse que aumenta de peso al ritmo indicado y que usted y su  beb evolucionan normalmente.  En algunas ocasiones se realiza una prueba de ultrasonido para confirmar el correcto desarrollo y evolucin del beb. Esta prueba se realiza con ondas sonoras inofensivas para el beb, de modo que el profesional pueda calcular ms precisamente la fecha del parto.  Analice con su mdico los analgsicos y la anestesia que recibir durante el trabajo de parto y el parto.  Comente la posibilidad de que necesite una cesrea y qu anestesia se recibir.  Informe a su mdico si sufre violencia familiar mental o fsica. A veces, se indica la prueba especializada sin estrs, la prueba de tolerancia a las contracciones y el perfil biofsico para asegurarse de que el beb no tiene problemas. El estudio del lquido amnitico que rodea al beb se llama amniocentesis. El lquido amnitico se obtiene introduciendo una aguja en el vientre (abdomen ). En ocasiones se lleva a cabo cerca del final del embarazo, si es necesario inducir a un parto. En este caso se realiza para asegurarse que los pulmones del beb estn lo suficientemente maduros como para que pueda vivir fuera del tero. Si los pulmones no han madurado y es peligroso que el beb nazca, se administrar a la madre una inyeccin de cortisona , 1 a 2 das antes del parto. . Esto ayuda a que los pulmones del beb maduren y sea ms seguro su nacimiento.  CAMBIOS QUE OCURREN EN EL TERCER TRIMESTRE DEL EMBARAZO  Su organismo atravesar numerosos cambios durante el embarazo. Estos pueden variar de una persona a otra. Converse con el profesional que la asiste acerca los cambios que   usted note y que la preocupen.   Durante el ltimo trimestre probablemente sienta un aumento del apetito. Es normal tener "antojos" de ciertas comidas. Esto vara de una persona a otra y de un embarazo a otro.  Podrn aparecer las primeras estras en las caderas, abdomen y mamas. Estos son cambios normales del cuerpo durante el embarazo. No existen  medicamentos ni ejercicios que puedan prevenir estos cambios.  La constipacin puede tratarse con un laxante o agregando fibra a su dieta. Beber grandes cantidades de lquidos, tomar fibras en forma de vegetales, frutas y granos integrales es de gran ayuda.  Tambin es beneficioso practicar actividad fsica. Si ha sido una persona activa hasta el embarazo, podr continuar con la mayora de las actividades durante el mismo. Si ha sido menos activa, puede ser beneficioso que comience con un programa de ejercicios, como realizar caminatas. Consulte con el profesional que la asiste antes de comenzar un programa de ejercicios.  Evite el consumo de cigarrillos, el alcohol, los medicamentos no recetados y las "drogas de la calle" durante el embarazo. Estas sustancias qumicas afectan la formacin y el desarrollo del beb. Evite estas sustancias durante todo el embarazo para asegurar el nacimiento de un beb sano.  Podr sentir dolor de espalda, tener vrices en las venas y hemorroides, o si ya los sufra, pueden empeorar.  Durante el tercer trimestre se cansar con ms facilidad, lo cual es normal.  Los movimientos del beb pueden ser ms fuertes y con ms frecuencia.  Puede que note dificultades para respirar normalmente.  El ombligo puede salir hacia afuera.  A veces sale una secrecin amarilla de las mamas, que se llama calostro.  Podr aparecer una secrecin mucosa con sangre. Esto suele ocurrir entre unos pocos das y una semana antes del parto. INSTRUCCIONES PARA EL CUIDADO EN EL HOGAR   Cumpla con las citas de control. Siga las indicaciones del mdico con respecto al uso de medicamentos, los ejercicios y la dieta.  Durante el embarazo debe obtener nutrientes para usted y para su beb. Consuma alimentos balanceados a intervalos regulares. Elija alimentos como carne, pescado, leche y otros productos lcteos descremados, vegetales, frutas, panes integrales y cereales. El mdico le informar  cul es el aumento de peso ideal.  Las relaciones sexuales pueden continuarse hasta casi el final del embarazo, si no se presentan otros problemas como prdida prematura (antes de tiempo) de lquido amnitico, hemorragia vaginal o dolor en el vientre (abdominal).  Realice actividad fsica todos los das, si no tiene restricciones. Consulte con el profesional que la asiste si no sabe con certeza si determinados ejercicios son seguros. El mayor aumento de peso se producir en los ltimos 2 trimestres del embarazo. El ejercicio ayuda a:  Controlar su peso.  Mantenerse en forma para el trabajo de parto y el parto .  Perder peso despus del parto.  Haga reposo con frecuencia, con las piernas elevadas, o segn lo necesite para evitar los calambres y el dolor de cintura.  Use un buen sostn o como los que se usan para hacer deportes para aliviar la sensibilidad de las mamas. Tambin puede serle til si lo usa mientras duerme. Si pierde calostro, podr utilizar apsitos en el sostn.  No utilice la baera con agua caliente, baos turcos y saunas.  Colquese el cinturn de seguridad cuando conduzca. Este la proteger a usted y al beb en caso de accidente.  Evite comer carne cruda y el contacto con los utensilios y desperdicios de los gatos. Estos elementos   contienen grmenes que pueden causar defectos de nacimiento en el beb.  Es fcil perder algo de orina durante el embarazo. Apretar y fortalecer los msculos de la pelvis la ayudar con este problema. Practique detener la miccin cuando est en el bao. Estos son los mismos msculos que necesita fortalecer. Son tambin los mismos msculos que utiliza cuando trata de evitar despedir gases. Puede practicar apretando estos msculos diez veces, y repetir esto tres veces por da aproximadamente. Una vez que conozca qu msculos debe apretar, no realice estos ejercicios durante la miccin. Puede favorecerle una infeccin si la orina vuelve hacia  atrs.  Pida ayuda si tienen necesidades financieras, teraputicas o nutricionales. El profesional podr ayudarla con respecto a estas necesidades, o derivarla a otros especialistas.  Haga una lista de nmeros telefnicos de emergencia y tngalos disponibles.  Planifique como obtener ayuda de familiares o amigos cuando regrese a casa desde el hospital.  Hacer un ensayo sobre la partida al hospital.  Tome clases prenatales con el padre para entender, practicar y hacer preguntas sobre el trabajo de parto y el alumbramiento.  Preparar la habitacin del beb / busque una guardera.  No viaje fuera de la ciudad a menos que sea absolutamente necesario y con el asesoramiento de su mdico.  Use slo zapatos de tacn bajo o sin tacn para tener mejor equilibrio y evitar cadas. USO DE MEDICAMENTOS Y CONSUMO DE DROGAS DURANTE EL EMBARAZO   Tome las vitaminas apropiadas para esta etapa tal como se le indic. Las vitaminas deben contener un miligramo de cido flico. Guarde todas las vitaminas fuera del alcance de los nios. La ingestin de slo un par de vitaminas o tabletas que contengan hierro pueden ocasionar la muerte en un beb o en un nio pequeo.  Evite el uso de todos los medicamentos, incluyendo hierbas, medicamentos de venta libre, sin receta o que no hayan sido sugeridos por su mdico. Slo tome medicamentos de venta libre o medicamentos recetados para el dolor, el malestar o fiebre como lo indique su mdico. No tome aspirina, ibuprofeno (Motrin, Advil, Nuprin) o naproxeno (Aleve) excepto que su mdico se lo indique.  Infrmele al profesional si consume alguna droga.  El alcohol se relaciona con ciertos defectos congnitos. Incluye el sndrome de alcoholismo fetal. Debe evitar absolutamente el consumo de alcohol, en cualquier forma. El fumar produce baja tasa de natalidad y bebs prematuros.  Las drogas ilegales o de la calle son muy perjudiciales para el beb. Estn absolutamente  prohibidas. Un beb que nace de una madre adicta, ser adicto al nacer. Ese beb tendr los mismos sntomas de abstinencia que un adulto. SOLICITE ATENCIN MDICA SI:  Tiene preguntas o preocupaciones relacionadas con el embarazo. Es mejor que llame para formular las preguntas si no puede esperar hasta la prxima visita, que sentirse preocupada por ellas.  DECISIONES ACERCA DE LA CIRCUNCISIN  Usted puede saber o no cul es el sexo de su beb. Si ya sabe que ser un varn, este es el momento de pensar acerca de la circuncisin. La circuncisin es la extirpacin del prepucio. Esta es la piel que cubre el extremo sensible del pene. No hay un motivo mdico que lo justifique. Generalmente la decisin se toma segn lo que sea popular en ese momento, o segn creencias religiosas. Podr conversar estos temas con su mdico o con el pediatra.  SOLICITE ATENCIN MDICA DE INMEDIATO SI:   La temperatura oral le sube a ms de 102 F (38.9 C) o lo que su mdico le   indique.  Tiene una prdida de lquido por la vagina (canal de parto). Si sospecha una ruptura de las membranas, tmese la temperatura y llame al profesional para informarlo sobre esto.  Observa unas pequeas manchas, una hemorragia vaginal o elimina cogulos. Notifique al profesional acerca de la cantidad y de cuntos apsitos est utilizando.  Presenta un olor desagradable en la secrecin vaginal y observa un cambio en el color, de transparente a blanco.  Ha vomitado durante ms de 24 horas.  Siente escalofros o le sube la fiebre.  Le falta el aire.  Siente ardor al orinar.  Baja o sube ms de 2 libras (900 g), o segn lo indicado por el profesional que la asiste.  Observa que sbitamente se le hinchan el rostro, las manos, los pies o las piernas.  Siente dolor en el vientre (abdominal). Las molestias en el ligamento redondo son una causa benigna frecuente de dolor abdominal durante el embarazo. El profesional que la asiste deber  evaluarla.  Presenta dolor de cabeza intenso que no se alivia.  Tiene problemas visuales, visin doble o borrosa.  Si no siente los movimientos del beb durante ms de 1 hora. Si piensa que el beb no se mueve tanto como lo haca habitualmente, coma algo que contenga azcar y recustese sobre el lado izquierdo durante una hora. El beb debe moverse al menos 4  5 veces por hora. Comunquese inmediatamente si el beb se mueve menos que lo indicado.  Se cae, se ve involucrada en un accidente automovilstico o sufre algn tipo de traumatismo.  En su hogar hay violencia mental o fsica. Document Released: 05/07/2005 Document Revised: 01/27/2012 [employer] Patient Information 2013 [employer].  

## 2012-10-06 NOTE — Progress Notes (Signed)
Pulse: 91 28 week labs today 1hr gtt due at 843

## 2012-10-06 NOTE — Progress Notes (Signed)
R hip pain at night- tylenol. No other complaints.

## 2012-10-07 LAB — ANTIBODY SCREEN: Antibody Screen: NEGATIVE

## 2012-10-07 LAB — GLUCOSE TOLERANCE, 1 HOUR (50G) W/O FASTING: Glucose, 1 Hour GTT: 96 mg/dL (ref 70–140)

## 2012-10-08 ENCOUNTER — Encounter: Payer: Self-pay | Admitting: Family Medicine

## 2012-10-27 ENCOUNTER — Ambulatory Visit (INDEPENDENT_AMBULATORY_CARE_PROVIDER_SITE_OTHER): Payer: Self-pay | Admitting: Family Medicine

## 2012-10-27 ENCOUNTER — Other Ambulatory Visit: Payer: Self-pay | Admitting: Family Medicine

## 2012-10-27 VITALS — BP 104/66 | Temp 97.0°F | Wt 145.2 lb

## 2012-10-27 DIAGNOSIS — Z3403 Encounter for supervision of normal first pregnancy, third trimester: Secondary | ICD-10-CM

## 2012-10-27 DIAGNOSIS — K219 Gastro-esophageal reflux disease without esophagitis: Secondary | ICD-10-CM

## 2012-10-27 LAB — POCT URINALYSIS DIP (DEVICE)
Bilirubin Urine: NEGATIVE
Glucose, UA: NEGATIVE mg/dL
Hgb urine dipstick: NEGATIVE
Nitrite: NEGATIVE
Urobilinogen, UA: 0.2 mg/dL (ref 0.0–1.0)

## 2012-10-27 NOTE — Progress Notes (Signed)
HR= 87 °

## 2012-10-27 NOTE — Progress Notes (Signed)
Vomiting occasionally after meals, worried about weight. Discussed reflux, eating smaller meals. Pt prefers lifestyle change to meds at this point. Overall wt gain for pregnancy okay but little gain since last appt. Discussed adding a healthy snack between meals. Otherwise no complaints.

## 2012-10-27 NOTE — Patient Instructions (Signed)
Acidez de estmago durante el embarazo  (Heartburn During Pregnancy)  La acidez es la sensacin de ardor en el pecho que se siente cuando el cido del estmago vuelve haca el esfago. La acidez (tambin llamada "reflujo") es frecuente en el embarazo debido a ciertos cambios hormonales (progesterona). La progesterona relaja la vlvula que separa el esfago del [place]. Esto hace que el cido suba al esfago y cause acidez. Tambin puede ocurrir Visual merchandiser debido a que el tero al agrandarse empuja el estmago, lo que hace que suba ms cido al esfago. Esto se produce especialmente en las ltimas etapas del embarazo. La acidez generalmente desaparece despus del parto.  CAUSAS  La hormona progesterona.  Cambios en los niveles hormonales.  El tero crece y [address] cido del estmago [place].  Comidas abundantes.  Ciertos alimentos y bebidas.  La prctica de ejercicios.  Aumento en la produccin de cido. SNTOMAS  Dolor intenso en el pecho o la parte baja de la garganta.  Gusto amargo en la boca.  Tos. DIAGNSTICO El mdico la diagnostica con una historia clnica cuidadosa en la que pregunta por sus molestias. Le indicar un anlisis de sangre para Clinical research associate cierto tipo de bacteria que se asocia con la [place]. En algunos casos se diagnostica recetando un medicamento para calmar la acidez y viendo si los sntomas mejoran. Durante el embarazo no es frecuente que se indique una endoscopa. En este procedimiento se [place] un tubo con Neomia Dear luz y una cmara en un extremo, y se examina el esfago y Investment banker, corporate.  TRATAMIENTO  El mdico aconsejar sobre el uso de medicamentos de venta libre (anticidos, medicamentos para disminuir la Engineering geologist) en los casos de sntomas leves.  El mdico indicar medicamentos para disminuir el cido estomacal o para proteger la superficie del [place].  El profesional indicar cambios en la dieta.  En casos graves, el mdico recomendar que eleve la  cabecera de la cama con bloques. (Dormir con ms almohadas no es Manufacturing systems engineer, ya que slo modifica la posicin de la cabeza y no mejora el problema principal del reflujo cido del estmago al esfago.) INSTRUCCIONES PARA EL CUIDADO DOMICILIARIO  Tome todos los medicamentos segn le indic su mdico.  Levante la cabecera de la cama con bloques bajo las patas.  No haga ejercicios enseguida despus de comer.  Evite comer 2  3 horas antes de ir a dormir.  No se acueste enseguida despus de comer.  Haga comidas pequeas durante Glass blower/designer de 3 comidas abundantes.  Identifique los alimentos o las bebidas que empeoran sus sntomas y evtelos. Los alimentos que debe evitar son:  Pimientos.  Chocolate.  Alimentos alto en grasas, incluidos los fritos.  Comidas muy condimentadas.  Ajo, cebolla.  Frutos ctricos, que incluyen naranjas, uvas, limones y limas.  Alimentos que contengan tomates o productos derivados del [place].  Menta.  Bebidas gaseosas y con cafena.  Vinagre. SOLICITE ATENCIN MDICA DE INMEDIATO SI:  Tiene dolor intenso en el pecho que baja hacia el brazo o hacia la mandbula o cuello.  Se siente sudoroso, mareado o sufre un desmayo.  Falta de aire.  Vomita sangre.  Dolor o dificultad para tragar.  Materia fecal con sangre o de color negro.  Tiene acidez ms de 3 veces por semana durante ms de 2 semanas. ASEGRESE DE QUE:   Comprende estas instrucciones.  Controlar su enfermedad.  Solicitar ayuda de inmediato si no mejora o si empeora. Document Released: 05/07/2005 Document Revised: 10/20/2011  ExitCare Patient Information 2013 [place], [place].  Embarazo  Systems analyst trimestre  (Pregnancy - Third Trimester) El tercer trimestre del Psychiatrist (los ltimos 3 meses) es el perodo en el cual tanto usted como su beb crecen con ms rapidez. El beb alcanza un largo de aproximadamente 50 cm. y pesa entre 2,700 y 4,500 kg. El beb gana ms tejido  graso y est listo para la vida fuera del cuerpo de la [place]. Mientras estn en el interior, los bebs tienen perodos de sueo y vigilia, Warehouse manager y tienen hipo. Quizs sienta pequeas contracciones del tero. Este es el falso trabajo de [place]. Tambin se las conoce como contracciones de [place] . Es como una prctica del parto. Los problemas ms habituales de esta etapa del embarazo incluyen mayor dificultad para respirar, hinchazn de las manos y los pies por retencin de lquidos y la necesidad de Geographical information systems officer con ms frecuencia debido a que el tero y el beb presionan sobre la vejiga.  EXAMENES PRENATALES   Durante los [employer], deber seguir realizndose anlisis de [place]. Estas pruebas se realizan para controlar su salud y la del beb. Los [employer] de sangre se Radiographer, therapeutic para [employer] niveles de algunos compuestos de la sangre (hemoglobina). La anemia (bajo nivel de hemoglobina) es frecuente durante el embarazo. Para prevenirla, se administran hierro y vitaminas. Tambin le tomarn nuevas anlisis para descartar diabetes. Podrn repetirle algunas de las [employer] hicieron previamente.  En cada visita le medirn el tamao del tero. Esto permite asegurar que el beb se desarrolla adecuadamente, segn la fecha del embarazo.  Le controlarn la presin arterial en cada visita prenatal. Esto es para asegurarse de que no sufre toxemia.  Le harn un anlisis de orina en cada visita prenatal, para descartar infecciones, diabetes y la presencia de protenas.  Tambin en cada visita controlarn su peso. Esto se realiza para asegurarse que aumenta de peso al ritmo indicado y que usted y su beb evolucionan normalmente.  En algunas ocasiones se realiza una prueba de ultrasonido para confirmar el correcto desarrollo y evolucin del beb. Esta prueba se realiza con ondas sonoras inofensivas para el beb, de modo que el profesional pueda calcular ms precisamente la fecha del  [place].  Analice con su mdico los analgsicos y la anestesia que recibir durante el [place] de parto y [place].  Comente la posibilidad de que necesite una cesrea y qu anestesia se recibir.  Informe a su mdico si sufre violencia familiar mental o fsica. A veces, se indica la prueba especializada sin estrs, la prueba de tolerancia a las contracciones y el perfil biofsico para asegurarse de que el beb no tiene problemas. El estudio del lquido amnitico que rodea al beb se llama amniocentesis. El lquido amnitico se obtiene introduciendo una aguja en el vientre (abdomen ). En ocasiones se lleva a cabo cerca del final del embarazo, si es necesario inducir a un parto. En este caso se realiza para asegurarse que los pulmones del beb estn lo suficientemente maduros como para que pueda vivir fuera del tero. Si los pulmones no han madurado y es peligroso que el beb nazca, se Building services engineer a la madre una inyeccin de [place] , 1 a 2 [address]  [address] antes del [address]. Vivia Budge ayuda a que los pulmones del beb maduren y sea ms seguro su nacimiento.  CAMBIOS QUE OCURREN EN EL TERCER TRIMESTRE DEL EMBARAZO  Su organismo atravesar numerosos cambios durante el [place]. Estos pueden variar de Neomia Dear persona a otra. Converse con el  profesional que la [employer] cambios que usted note y que la preocupen.   Durante el ltimo trimestre probablemente sienta un aumento del apetito. Es normal tener "antojos" de Development worker, community. Esto vara de Neomia Dear persona a otra y de un embarazo a Therapist, art.  Podrn aparecer las primeras estras en las caderas, abdomen y [place]. Estos son cambios normales del cuerpo durante el [place]. No existen medicamentos ni ejercicios que puedan prevenir [employer].  La constipacin puede tratarse con un laxante o agregando fibra a su dieta. Beber grandes cantidades de lquidos, tomar fibras en forma de vegetales, frutas y granos integrales es de gran [place].  Tambin es beneficioso practicar  actividad fsica. Si ha sido una persona Engineer, mining, podr continuar con la [employer]son de las actividades durante el mismo. Si ha sido [employer], puede ser beneficioso que comience con un programa de ejercicios, Museum/gallery exhibitions officer. Consulte con el profesional que la asiste antes de comenzar un programa de ejercicios.  Evite el consumo de cigarrillos, el alcohol, los medicamentos no recetados y las "drogas de la calle" durante el Psychiatrist. Estas sustancias qumicas afectan la formacin y el desarrollo del beb. Evite estas sustancias durante todo el embarazo para asegurar el nacimiento de un beb sano.  Podr sentir dolor de espalda, tener vrices en las venas y hemorroides, o si ya los sufra, pueden [place].  Durante el tercer trimestre se cansar con ms facilidad, lo cual es normal.  Los movimientos del beb pueden ser ms fuertes y con ms frecuencia.  Puede que note dificultades para respirar normalmente.  El ombligo puede salir hacia afuera.  A veces sale Veterinary surgeon de las [place], que se llama Product manager.  Podr aparecer Neomia Dear secrecin mucosa con sangre. Esto suele ocurrir General Electric unos [address] y Neomia Dear semana antes del [place]. INSTRUCCIONES PARA EL CUIDADO EN EL HOGAR   Cumpla con las citas de control. Siga las indicaciones del mdico con respecto al uso de [employer], los ejercicios y la dieta.  Durante el embarazo debe obtener nutrientes para usted y para su beb. Consuma alimentos balanceados a intervalos regulares. Elija alimentos como carne, pescado, [place] y otros productos lcteos descremados, vegetales, frutas, panes integrales y cereales. El [employer] informar cul es el aumento de peso ideal.  Las relaciones sexuales pueden continuarse hasta casi el final del embarazo, si no se presentan otros problemas como prdida prematura (antes de [place]) de lquido amnitico, hemorragia vaginal o dolor en el vientre (abdominal).  Realice [employer], si no tiene restricciones. Consulte con el profesional que la asiste si no sabe con certeza si determinados ejercicios son seguros. El mayor aumento de peso se producir en los ltimos 2 trimestres del Psychiatrist. El ejercicio ayuda a:  Engineering geologist.  Mantenerse en forma para el trabajo de parto y [place] .  Perder peso despus del parto.  Haga reposo con frecuencia, con las piernas elevadas, o segn lo necesite para evitar los calambres y el dolor de cintura.  Use un buen sostn o como los que se usan para hacer deportes para Paramedic la sensibilidad de las [place]. Tambin puede serle til si lo [place] mientras duerme. Si pierde Product manager, podr [employer]in.  No utilice la baera con agua caliente, baos turcos y saunas.  Colquese el cinturn de seguridad cuando conduzca. Este la proteger a usted y al beb en caso de accidente.  Evite comer carne cruda y Careers adviser con los  utensilios y desperdicios de los gatos. Estos elementos contienen grmenes que pueden causar defectos de nacimiento en el beb.  Es fcil perder algo de orina durante el [place]. Apretar y Chief Operating Officer los msculos de la pelvis la ayudar con este problema. Practique detener la miccin cuando est en el bao. Estos son los mismos msculos que Development worker, international aid. Son [employer] mismos msculos que utiliza cuando trata de evitar despedir gases. Puede practicar apretando estos msculos [employer], y repetir esto tres veces por da aproximadamente. Una vez que conozca qu msculos debe apretar, no realice estos ejercicios durante la miccin. Puede favorecerle una infeccin si la orina vuelve hacia atrs.  Pida ayuda si tienen necesidades financieras, teraputicas o nutricionales. El profesional podr ayudarla con respecto a estas necesidades, o derivarla a otros especialistas.  Haga una lista de nmeros telefnicos de emergencia y tngalos disponibles.  Planifique como obtener ayuda de familiares  o amigos cuando regrese a Programmer, applications hospital.  Hacer un ensayo sobre la partida al hospital.  [place] clases prenatales con el padre para entender, practicar y hacer preguntas sobre el [place] de parto y el alumbramiento.  Preparar la habitacin del beb / busque Fatima Blank.  No viaje fuera de la ciudad a menos que sea absolutamente necesario y con el asesoramiento de su mdico.  Use slo zapatos de tacn bajo o sin tacn para tener mejor equilibrio y Automotive engineer cadas. USO DE MEDICAMENTOS Y CONSUMO DE DROGAS DURANTE EL [facility]   Tome las vitaminas apropiadas para esta etapa tal como se le indic. Las vitaminas deben contener un miligramo de cido flico. Guarde todas las vitaminas fuera del alcance de los nios. La ingestin de slo un par de vitaminas o tabletas que contengan hierro pueden ocasionar la [employer] en un beb o en un nio pequeo.  Evite el uso de [employer] Mutual [employer] Omaha, incluyendo hierbas, medicamentos de [place], sin receta o que no hayan sido sugeridos por su mdico. Slo tome medicamentos de venta libre o medicamentos recetados para Chief Technology Officer, Environmental health practitioner o fiebre como lo indique su mdico. No tome aspirina, ibuprofeno (Motrin, Advil, Nuprin) o naproxeno (Aleve) excepto que su mdico se lo indique.  Infrmele al profesional si consume alguna droga.  El alcohol se relaciona con ciertos defectos congnitos. Incluye el sndrome de alcoholismo fetal. Debe evitar absolutamente el consumo de alcohol, en cualquier forma. El fumar produce baja tasa de natalidad y bebs prematuros.  Las drogas ilegales o de la calle son muy perjudiciales para el beb. Estn absolutamente prohibidas. Un beb que nace de [employer], ser adicto al nacer. Ese beb tendr los mismos sntomas de abstinencia que un adulto. SOLICITE ATENCIN MDICA SI:  Tiene preguntas o preocupaciones relacionadas con el embarazo. Es mejor que llame para formular las preguntas si no puede esperar hasta la  prxima visita, que sentirse preocupada por ellas.  DECISIONES ACERCA DE LA CIRCUNCISIN  Usted puede saber o no cul es el sexo de su beb. Si ya sabe que ser un varn, este es el momento de pensar acerca de la circuncisin. La circuncisin es la extirpacin del prepucio. Esta es la piel que cubre el extremo sensible del pene. No hay un motivo mdico que lo justifique. Generalmente la decisin se toma segn lo que sea popular en ese momento, o segn creencias religiosas. Podr conversar estos temas con su mdico o con el pediatra.  SOLICITE ATENCIN MDICA DE INMEDIATO SI:   La temperatura oral le sube a ms de 102 F (  38.9 C) o lo que su mdico le indique.  Tiene una prdida de lquido por la vagina (canal de parto). Si sospecha una ruptura de las [place], tmese la temperatura y llame al profesional para informarlo sobre esto.  Observa unas pequeas manchas, una hemorragia vaginal o elimina cogulos. Notifique al profesional acerca de la cantidad y de cuntos apsitos est utilizando.  Presenta un olor desagradable en la secrecin vaginal y observa un cambio en el color, de transparente a blanco.  Ha vomitado durante ms de 24 horas.  Siente escalofros o le sube la fiebre.  Le falta el aire.  Siente ardor al Beatrix Shipper.  Baja o sube ms de 2 libras (900 g), o segn lo indicado por el profesional que la asiste.  Observa que sbitamente se le hinchan el rostro, las manos, los pies o las piernas.  Siente dolor en el vientre (abdominal). Las [employer] en el ligamento redondo son Neomia Dear causa benigna frecuente de dolor abdominal durante el embarazo. El profesional que la asiste deber evaluarla.  Presenta dolor de cabeza intenso que no se [place].  Tiene problemas visuales, visin doble o borrosa.  Si no siente los movimientos del beb durante ms de 1 hora. Si piensa que el beb no se mueve tanto como lo haca habitualmente, coma algo que Psychologist, clinical y [employer] Corporation lado  izquierdo durante [place]. El beb debe moverse al menos 4  5 veces por hora. Comunquese inmediatamente si el beb se mueve menos que lo indicado.  Se cae, se ve involucrada en un accidente automovilstico o sufre algn tipo de traumatismo.  En su hogar hay violencia mental o fsica. Document Released: 05/07/2005 Document Revised: 01/27/2012 [facility] Patient Information 2013 [place], [place].

## 2012-11-10 ENCOUNTER — Ambulatory Visit (INDEPENDENT_AMBULATORY_CARE_PROVIDER_SITE_OTHER): Payer: Self-pay | Admitting: Advanced Practice Midwife

## 2012-11-10 ENCOUNTER — Encounter: Payer: Self-pay | Admitting: Advanced Practice Midwife

## 2012-11-10 ENCOUNTER — Other Ambulatory Visit: Payer: Self-pay | Admitting: Advanced Practice Midwife

## 2012-11-10 VITALS — BP 106/72 | Temp 97.8°F | Wt 149.5 lb

## 2012-11-10 DIAGNOSIS — O0932 Supervision of pregnancy with insufficient antenatal care, second trimester: Secondary | ICD-10-CM

## 2012-11-10 DIAGNOSIS — O0930 Supervision of pregnancy with insufficient antenatal care, unspecified trimester: Secondary | ICD-10-CM

## 2012-11-10 LAB — POCT URINALYSIS DIP (DEVICE)
Bilirubin Urine: NEGATIVE
Glucose, UA: NEGATIVE mg/dL
Nitrite: NEGATIVE
pH: 7 (ref 5.0–8.0)

## 2012-11-10 NOTE — Patient Instructions (Signed)
Embarazo  Tercer trimestre  (Pregnancy - Third Trimester) El tercer trimestre del embarazo (los ltimos 3 meses) es el perodo en el cual tanto usted como su beb crecen con ms rapidez. El beb alcanza un largo de aproximadamente 50 cm. y pesa entre 2,700 y 4,500 kg. El beb gana ms tejido graso y est listo para la vida fuera del cuerpo de la madre. Mientras estn en el interior, los bebs tienen perodos de sueo y vigilia, succionan el pulgar y tienen hipo. Quizs sienta pequeas contracciones del tero. Este es el falso trabajo de parto. Tambin se las conoce como contracciones de Braxton-Hicks . Es como una prctica del parto. Los problemas ms habituales de esta etapa del embarazo incluyen mayor dificultad para respirar, hinchazn de las manos y los pies por retencin de lquidos y la necesidad de orinar con ms frecuencia debido a que el tero y el beb presionan sobre la vejiga.  EXAMENES PRENATALES   Durante los exmenes prenatales, deber seguir realizndose anlisis de sangre. Estas pruebas se realizan para controlar su salud y la del beb. Los anlisis de sangre se realizan para conocer los niveles de algunos compuestos de la sangre (hemoglobina). La anemia (bajo nivel de hemoglobina) es frecuente durante el embarazo. Para prevenirla, se administran hierro y vitaminas. Tambin le tomarn nuevas anlisis para descartar diabetes. Podrn repetirle algunas de las pruebas que le hicieron previamente.  En cada visita le medirn el tamao del tero. Esto permite asegurar que el beb se desarrolla adecuadamente, segn la fecha del embarazo.  Le controlarn la presin arterial en cada visita prenatal. Esto es para asegurarse de que no sufre toxemia.  Le harn un anlisis de orina en cada visita prenatal, para descartar infecciones, diabetes y la presencia de protenas.  Tambin en cada visita controlarn su peso. Esto se realiza para asegurarse que aumenta de peso al ritmo indicado y que usted y su  beb evolucionan normalmente.  En algunas ocasiones se realiza una prueba de ultrasonido para confirmar el correcto desarrollo y evolucin del beb. Esta prueba se realiza con ondas sonoras inofensivas para el beb, de modo que el profesional pueda calcular ms precisamente la fecha del parto.  Analice con su mdico los analgsicos y la anestesia que recibir durante el trabajo de parto y el parto.  Comente la posibilidad de que necesite una cesrea y qu anestesia se recibir.  Informe a su mdico si sufre violencia familiar mental o fsica. A veces, se indica la prueba especializada sin estrs, la prueba de tolerancia a las contracciones y el perfil biofsico para asegurarse de que el beb no tiene problemas. El estudio del lquido amnitico que rodea al beb se llama amniocentesis. El lquido amnitico se obtiene introduciendo una aguja en el vientre (abdomen ). En ocasiones se lleva a cabo cerca del final del embarazo, si es necesario inducir a un parto. En este caso se realiza para asegurarse que los pulmones del beb estn lo suficientemente maduros como para que pueda vivir fuera del tero. Si los pulmones no han madurado y es peligroso que el beb nazca, se administrar a la madre una inyeccin de cortisona , 1 a 2 das antes del parto. . Esto ayuda a que los pulmones del beb maduren y sea ms seguro su nacimiento.  CAMBIOS QUE OCURREN EN EL TERCER TRIMESTRE DEL EMBARAZO  Su organismo atravesar numerosos cambios durante el embarazo. Estos pueden variar de una persona a otra. Converse con el profesional que la asiste acerca los cambios que   usted note y que la preocupen.   Durante el ltimo trimestre probablemente sienta un aumento del apetito. Es normal tener "antojos" de ciertas comidas. Esto vara de una persona a otra y de un embarazo a otro.  Podrn aparecer las primeras estras en las caderas, abdomen y mamas. Estos son cambios normales del cuerpo durante el embarazo. No existen  medicamentos ni ejercicios que puedan prevenir estos cambios.  La constipacin puede tratarse con un laxante o agregando fibra a su dieta. Beber grandes cantidades de lquidos, tomar fibras en forma de vegetales, frutas y granos integrales es de gran ayuda.  Tambin es beneficioso practicar actividad fsica. Si ha sido una persona activa hasta el embarazo, podr continuar con la mayora de las actividades durante el mismo. Si ha sido menos activa, puede ser beneficioso que comience con un programa de ejercicios, como realizar caminatas. Consulte con el profesional que la asiste antes de comenzar un programa de ejercicios.  Evite el consumo de cigarrillos, el alcohol, los medicamentos no recetados y las "drogas de la calle" durante el embarazo. Estas sustancias qumicas afectan la formacin y el desarrollo del beb. Evite estas sustancias durante todo el embarazo para asegurar el nacimiento de un beb sano.  Podr sentir dolor de espalda, tener vrices en las venas y hemorroides, o si ya los sufra, pueden empeorar.  Durante el tercer trimestre se cansar con ms facilidad, lo cual es normal.  Los movimientos del beb pueden ser ms fuertes y con ms frecuencia.  Puede que note dificultades para respirar normalmente.  El ombligo puede salir hacia afuera.  A veces sale una secrecin amarilla de las mamas, que se llama calostro.  Podr aparecer una secrecin mucosa con sangre. Esto suele ocurrir entre unos pocos das y una semana antes del parto. INSTRUCCIONES PARA EL CUIDADO EN EL HOGAR   Cumpla con las citas de control. Siga las indicaciones del mdico con respecto al uso de medicamentos, los ejercicios y la dieta.  Durante el embarazo debe obtener nutrientes para usted y para su beb. Consuma alimentos balanceados a intervalos regulares. Elija alimentos como carne, pescado, leche y otros productos lcteos descremados, vegetales, frutas, panes integrales y cereales. El mdico le informar  cul es el aumento de peso ideal.  Las relaciones sexuales pueden continuarse hasta casi el final del embarazo, si no se presentan otros problemas como prdida prematura (antes de tiempo) de lquido amnitico, hemorragia vaginal o dolor en el vientre (abdominal).  Realice actividad fsica todos los das, si no tiene restricciones. Consulte con el profesional que la asiste si no sabe con certeza si determinados ejercicios son seguros. El mayor aumento de peso se producir en los ltimos 2 trimestres del embarazo. El ejercicio ayuda a:  Controlar su peso.  Mantenerse en forma para el trabajo de parto y el parto .  Perder peso despus del parto.  Haga reposo con frecuencia, con las piernas elevadas, o segn lo necesite para evitar los calambres y el dolor de cintura.  Use un buen sostn o como los que se usan para hacer deportes para aliviar la sensibilidad de las mamas. Tambin puede serle til si lo usa mientras duerme. Si pierde calostro, podr utilizar apsitos en el sostn.  No utilice la baera con agua caliente, baos turcos y saunas.  Colquese el cinturn de seguridad cuando conduzca. Este la proteger a usted y al beb en caso de accidente.  Evite comer carne cruda y el contacto con los utensilios y desperdicios de los gatos. Estos elementos   contienen grmenes que pueden causar defectos de nacimiento en el beb.  Es fcil perder algo de orina durante el embarazo. Apretar y fortalecer los msculos de la pelvis la ayudar con este problema. Practique detener la miccin cuando est en el bao. Estos son los mismos msculos que necesita fortalecer. Son tambin los mismos msculos que utiliza cuando trata de evitar despedir gases. Puede practicar apretando estos msculos diez veces, y repetir esto tres veces por da aproximadamente. Una vez que conozca qu msculos debe apretar, no realice estos ejercicios durante la miccin. Puede favorecerle una infeccin si la orina vuelve hacia  atrs.  Pida ayuda si tienen necesidades financieras, teraputicas o nutricionales. El profesional podr ayudarla con respecto a estas necesidades, o derivarla a otros especialistas.  Haga una lista de nmeros telefnicos de emergencia y tngalos disponibles.  Planifique como obtener ayuda de familiares o amigos cuando regrese a casa desde el hospital.  Hacer un ensayo sobre la partida al hospital.  Tome clases prenatales con el padre para entender, practicar y hacer preguntas sobre el trabajo de parto y el alumbramiento.  Preparar la habitacin del beb / busque una guardera.  No viaje fuera de la ciudad a menos que sea absolutamente necesario y con el asesoramiento de su mdico.  Use slo zapatos de tacn bajo o sin tacn para tener mejor equilibrio y evitar cadas. USO DE MEDICAMENTOS Y CONSUMO DE DROGAS DURANTE EL EMBARAZO   Tome las vitaminas apropiadas para esta etapa tal como se le indic. Las vitaminas deben contener un miligramo de cido flico. Guarde todas las vitaminas fuera del alcance de los nios. La ingestin de slo un par de vitaminas o tabletas que contengan hierro pueden ocasionar la muerte en un beb o en un nio pequeo.  Evite el uso de todos los medicamentos, incluyendo hierbas, medicamentos de venta libre, sin receta o que no hayan sido sugeridos por su mdico. Slo tome medicamentos de venta libre o medicamentos recetados para el dolor, el malestar o fiebre como lo indique su mdico. No tome aspirina, ibuprofeno (Motrin, Advil, Nuprin) o naproxeno (Aleve) excepto que su mdico se lo indique.  Infrmele al profesional si consume alguna droga.  El alcohol se relaciona con ciertos defectos congnitos. Incluye el sndrome de alcoholismo fetal. Debe evitar absolutamente el consumo de alcohol, en cualquier forma. El fumar produce baja tasa de natalidad y bebs prematuros.  Las drogas ilegales o de la calle son muy perjudiciales para el beb. Estn absolutamente  prohibidas. Un beb que nace de una madre adicta, ser adicto al nacer. Ese beb tendr los mismos sntomas de abstinencia que un adulto. SOLICITE ATENCIN MDICA SI:  Tiene preguntas o preocupaciones relacionadas con el embarazo. Es mejor que llame para formular las preguntas si no puede esperar hasta la prxima visita, que sentirse preocupada por ellas.  DECISIONES ACERCA DE LA CIRCUNCISIN  Usted puede saber o no cul es el sexo de su beb. Si ya sabe que ser un varn, este es el momento de pensar acerca de la circuncisin. La circuncisin es la extirpacin del prepucio. Esta es la piel que cubre el extremo sensible del pene. No hay un motivo mdico que lo justifique. Generalmente la decisin se toma segn lo que sea popular en ese momento, o segn creencias religiosas. Podr conversar estos temas con su mdico o con el pediatra.  SOLICITE ATENCIN MDICA DE INMEDIATO SI:   La temperatura oral le sube a ms de 102 F (38.9 C) o lo que su mdico le   indique.  Tiene una prdida de lquido por la vagina (canal de parto). Si sospecha una ruptura de las membranas, tmese la temperatura y llame al profesional para informarlo sobre esto.  Observa unas pequeas manchas, una hemorragia vaginal o elimina cogulos. Notifique al profesional acerca de la cantidad y de cuntos apsitos est utilizando.  Presenta un olor desagradable en la secrecin vaginal y observa un cambio en el color, de transparente a blanco.  Ha vomitado durante ms de 24 horas.  Siente escalofros o le sube la fiebre.  Le falta el aire.  Siente ardor al orinar.  Baja o sube ms de 2 libras (900 g), o segn lo indicado por el profesional que la asiste.  Observa que sbitamente se le hinchan el rostro, las manos, los pies o las piernas.  Siente dolor en el vientre (abdominal). Las molestias en el ligamento redondo son una causa benigna frecuente de dolor abdominal durante el embarazo. El profesional que la asiste deber  evaluarla.  Presenta dolor de cabeza intenso que no se alivia.  Tiene problemas visuales, visin doble o borrosa.  Si no siente los movimientos del beb durante ms de 1 hora. Si piensa que el beb no se mueve tanto como lo haca habitualmente, coma algo que contenga azcar y recustese sobre el lado izquierdo durante una hora. El beb debe moverse al menos 4  5 veces por hora. Comunquese inmediatamente si el beb se mueve menos que lo indicado.  Se cae, se ve involucrada en un accidente automovilstico o sufre algn tipo de traumatismo.  En su hogar hay violencia mental o fsica. Document Released: 05/07/2005 Document Revised: 01/27/2012 [employer] Patient Information 2013 [employer].  

## 2012-11-10 NOTE — Progress Notes (Signed)
Doing well.  Has some pressure and feet hurt when she walks a lot, but no contractions, leaking or bleeding.

## 2012-11-24 ENCOUNTER — Other Ambulatory Visit: Payer: Self-pay | Admitting: Family Medicine

## 2012-11-24 ENCOUNTER — Ambulatory Visit (INDEPENDENT_AMBULATORY_CARE_PROVIDER_SITE_OTHER): Payer: Self-pay | Admitting: Family Medicine

## 2012-11-24 VITALS — BP 109/77 | Wt 155.9 lb

## 2012-11-24 DIAGNOSIS — O0930 Supervision of pregnancy with insufficient antenatal care, unspecified trimester: Secondary | ICD-10-CM

## 2012-11-24 DIAGNOSIS — O0933 Supervision of pregnancy with insufficient antenatal care, third trimester: Secondary | ICD-10-CM

## 2012-11-24 LAB — POCT URINALYSIS DIP (DEVICE)
Glucose, UA: NEGATIVE mg/dL
Ketones, ur: NEGATIVE mg/dL
Protein, ur: NEGATIVE mg/dL

## 2012-11-24 NOTE — Progress Notes (Signed)
Pulse: 86

## 2012-11-24 NOTE — Progress Notes (Signed)
Starting to have some swelling in feet, but resolves with elevation. BP good. No headache or vision changes. No other complaints.

## 2012-11-24 NOTE — Patient Instructions (Signed)
Embarazo  Tercer trimestre  (Pregnancy - Third Trimester) El tercer trimestre del embarazo (los ltimos 3 meses) es el perodo en el cual tanto usted como su beb crecen con ms rapidez. El beb alcanza un largo de aproximadamente 50 cm. y pesa entre 2,700 y 4,500 kg. El beb gana ms tejido graso y est listo para la vida fuera del cuerpo de la madre. Mientras estn en el interior, los bebs tienen perodos de sueo y vigilia, succionan el pulgar y tienen hipo. Quizs sienta pequeas contracciones del tero. Este es el falso trabajo de parto. Tambin se las conoce como contracciones de Braxton-Hicks . Es como una prctica del parto. Los problemas ms habituales de esta etapa del embarazo incluyen mayor dificultad para respirar, hinchazn de las manos y los pies por retencin de lquidos y la necesidad de orinar con ms frecuencia debido a que el tero y el beb presionan sobre la vejiga.  EXAMENES PRENATALES   Durante los exmenes prenatales, deber seguir realizndose anlisis de sangre. Estas pruebas se realizan para controlar su salud y la del beb. Los anlisis de sangre se realizan para conocer los niveles de algunos compuestos de la sangre (hemoglobina). La anemia (bajo nivel de hemoglobina) es frecuente durante el embarazo. Para prevenirla, se administran hierro y vitaminas. Tambin le tomarn nuevas anlisis para descartar diabetes. Podrn repetirle algunas de las pruebas que le hicieron previamente.  En cada visita le medirn el tamao del tero. Esto permite asegurar que el beb se desarrolla adecuadamente, segn la fecha del embarazo.  Le controlarn la presin arterial en cada visita prenatal. Esto es para asegurarse de que no sufre toxemia.  Le harn un anlisis de orina en cada visita prenatal, para descartar infecciones, diabetes y la presencia de protenas.  Tambin en cada visita controlarn su peso. Esto se realiza para asegurarse que aumenta de peso al ritmo indicado y que usted y su  beb evolucionan normalmente.  En algunas ocasiones se realiza una prueba de ultrasonido para confirmar el correcto desarrollo y evolucin del beb. Esta prueba se realiza con ondas sonoras inofensivas para el beb, de modo que el profesional pueda calcular ms precisamente la fecha del parto.  Analice con su mdico los analgsicos y la anestesia que recibir durante el trabajo de parto y el parto.  Comente la posibilidad de que necesite una cesrea y qu anestesia se recibir.  Informe a su mdico si sufre violencia familiar mental o fsica. A veces, se indica la prueba especializada sin estrs, la prueba de tolerancia a las contracciones y el perfil biofsico para asegurarse de que el beb no tiene problemas. El estudio del lquido amnitico que rodea al beb se llama amniocentesis. El lquido amnitico se obtiene introduciendo una aguja en el vientre (abdomen ). En ocasiones se lleva a cabo cerca del final del embarazo, si es necesario inducir a un parto. En este caso se realiza para asegurarse que los pulmones del beb estn lo suficientemente maduros como para que pueda vivir fuera del tero. Si los pulmones no han madurado y es peligroso que el beb nazca, se administrar a la madre una inyeccin de cortisona , 1 a 2 das antes del parto. . Esto ayuda a que los pulmones del beb maduren y sea ms seguro su nacimiento.  CAMBIOS QUE OCURREN EN EL TERCER TRIMESTRE DEL EMBARAZO  Su organismo atravesar numerosos cambios durante el embarazo. Estos pueden variar de una persona a otra. Converse con el profesional que la asiste acerca los cambios que   usted note y que la preocupen.   Durante el ltimo trimestre probablemente sienta un aumento del apetito. Es normal tener "antojos" de ciertas comidas. Esto vara de una persona a otra y de un embarazo a otro.  Podrn aparecer las primeras estras en las caderas, abdomen y mamas. Estos son cambios normales del cuerpo durante el embarazo. No existen  medicamentos ni ejercicios que puedan prevenir estos cambios.  La constipacin puede tratarse con un laxante o agregando fibra a su dieta. Beber grandes cantidades de lquidos, tomar fibras en forma de vegetales, frutas y granos integrales es de gran ayuda.  Tambin es beneficioso practicar actividad fsica. Si ha sido una persona activa hasta el embarazo, podr continuar con la mayora de las actividades durante el mismo. Si ha sido menos activa, puede ser beneficioso que comience con un programa de ejercicios, como realizar caminatas. Consulte con el profesional que la asiste antes de comenzar un programa de ejercicios.  Evite el consumo de cigarrillos, el alcohol, los medicamentos no recetados y las "drogas de la calle" durante el embarazo. Estas sustancias qumicas afectan la formacin y el desarrollo del beb. Evite estas sustancias durante todo el embarazo para asegurar el nacimiento de un beb sano.  Podr sentir dolor de espalda, tener vrices en las venas y hemorroides, o si ya los sufra, pueden empeorar.  Durante el tercer trimestre se cansar con ms facilidad, lo cual es normal.  Los movimientos del beb pueden ser ms fuertes y con ms frecuencia.  Puede que note dificultades para respirar normalmente.  El ombligo puede salir hacia afuera.  A veces sale una secrecin amarilla de las mamas, que se llama calostro.  Podr aparecer una secrecin mucosa con sangre. Esto suele ocurrir entre unos pocos das y una semana antes del parto. INSTRUCCIONES PARA EL CUIDADO EN EL HOGAR   Cumpla con las citas de control. Siga las indicaciones del mdico con respecto al uso de medicamentos, los ejercicios y la dieta.  Durante el embarazo debe obtener nutrientes para usted y para su beb. Consuma alimentos balanceados a intervalos regulares. Elija alimentos como carne, pescado, leche y otros productos lcteos descremados, vegetales, frutas, panes integrales y cereales. El mdico le informar  cul es el aumento de peso ideal.  Las relaciones sexuales pueden continuarse hasta casi el final del embarazo, si no se presentan otros problemas como prdida prematura (antes de tiempo) de lquido amnitico, hemorragia vaginal o dolor en el vientre (abdominal).  Realice actividad fsica todos los das, si no tiene restricciones. Consulte con el profesional que la asiste si no sabe con certeza si determinados ejercicios son seguros. El mayor aumento de peso se producir en los ltimos 2 trimestres del embarazo. El ejercicio ayuda a:  Controlar su peso.  Mantenerse en forma para el trabajo de parto y el parto .  Perder peso despus del parto.  Haga reposo con frecuencia, con las piernas elevadas, o segn lo necesite para evitar los calambres y el dolor de cintura.  Use un buen sostn o como los que se usan para hacer deportes para aliviar la sensibilidad de las mamas. Tambin puede serle til si lo usa mientras duerme. Si pierde calostro, podr utilizar apsitos en el sostn.  No utilice la baera con agua caliente, baos turcos y saunas.  Colquese el cinturn de seguridad cuando conduzca. Este la proteger a usted y al beb en caso de accidente.  Evite comer carne cruda y el contacto con los utensilios y desperdicios de los gatos. Estos elementos   contienen grmenes que pueden causar defectos de nacimiento en el beb.  Es fcil perder algo de orina durante el embarazo. Apretar y fortalecer los msculos de la pelvis la ayudar con este problema. Practique detener la miccin cuando est en el bao. Estos son los mismos msculos que necesita fortalecer. Son tambin los mismos msculos que utiliza cuando trata de evitar despedir gases. Puede practicar apretando estos msculos diez veces, y repetir esto tres veces por da aproximadamente. Una vez que conozca qu msculos debe apretar, no realice estos ejercicios durante la miccin. Puede favorecerle una infeccin si la orina vuelve hacia  atrs.  Pida ayuda si tienen necesidades financieras, teraputicas o nutricionales. El profesional podr ayudarla con respecto a estas necesidades, o derivarla a otros especialistas.  Haga una lista de nmeros telefnicos de emergencia y tngalos disponibles.  Planifique como obtener ayuda de familiares o amigos cuando regrese a casa desde el hospital.  Hacer un ensayo sobre la partida al hospital.  Tome clases prenatales con el padre para entender, practicar y hacer preguntas sobre el trabajo de parto y el alumbramiento.  Preparar la habitacin del beb / busque una guardera.  No viaje fuera de la ciudad a menos que sea absolutamente necesario y con el asesoramiento de su mdico.  Use slo zapatos de tacn bajo o sin tacn para tener mejor equilibrio y evitar cadas. USO DE MEDICAMENTOS Y CONSUMO DE DROGAS DURANTE EL EMBARAZO   Tome las vitaminas apropiadas para esta etapa tal como se le indic. Las vitaminas deben contener un miligramo de cido flico. Guarde todas las vitaminas fuera del alcance de los nios. La ingestin de slo un par de vitaminas o tabletas que contengan hierro pueden ocasionar la muerte en un beb o en un nio pequeo.  Evite el uso de todos los medicamentos, incluyendo hierbas, medicamentos de venta libre, sin receta o que no hayan sido sugeridos por su mdico. Slo tome medicamentos de venta libre o medicamentos recetados para el dolor, el malestar o fiebre como lo indique su mdico. No tome aspirina, ibuprofeno (Motrin, Advil, Nuprin) o naproxeno (Aleve) excepto que su mdico se lo indique.  Infrmele al profesional si consume alguna droga.  El alcohol se relaciona con ciertos defectos congnitos. Incluye el sndrome de alcoholismo fetal. Debe evitar absolutamente el consumo de alcohol, en cualquier forma. El fumar produce baja tasa de natalidad y bebs prematuros.  Las drogas ilegales o de la calle son muy perjudiciales para el beb. Estn absolutamente  prohibidas. Un beb que nace de una madre adicta, ser adicto al nacer. Ese beb tendr los mismos sntomas de abstinencia que un adulto. SOLICITE ATENCIN MDICA SI:  Tiene preguntas o preocupaciones relacionadas con el embarazo. Es mejor que llame para formular las preguntas si no puede esperar hasta la prxima visita, que sentirse preocupada por ellas.  DECISIONES ACERCA DE LA CIRCUNCISIN  Usted puede saber o no cul es el sexo de su beb. Si ya sabe que ser un varn, este es el momento de pensar acerca de la circuncisin. La circuncisin es la extirpacin del prepucio. Esta es la piel que cubre el extremo sensible del pene. No hay un motivo mdico que lo justifique. Generalmente la decisin se toma segn lo que sea popular en ese momento, o segn creencias religiosas. Podr conversar estos temas con su mdico o con el pediatra.  SOLICITE ATENCIN MDICA DE INMEDIATO SI:   La temperatura oral le sube a ms de 102 F (38.9 C) o lo que su mdico le   indique.  Tiene una prdida de lquido por la vagina (canal de parto). Si sospecha una ruptura de las membranas, tmese la temperatura y llame al profesional para informarlo sobre esto.  Observa unas pequeas manchas, una hemorragia vaginal o elimina cogulos. Notifique al profesional acerca de la cantidad y de cuntos apsitos est utilizando.  Presenta un olor desagradable en la secrecin vaginal y observa un cambio en el color, de transparente a blanco.  Ha vomitado durante ms de 24 horas.  Siente escalofros o le sube la fiebre.  Le falta el aire.  Siente ardor al orinar.  Baja o sube ms de 2 libras (900 g), o segn lo indicado por el profesional que la asiste.  Observa que sbitamente se le hinchan el rostro, las manos, los pies o las piernas.  Siente dolor en el vientre (abdominal). Las molestias en el ligamento redondo son una causa benigna frecuente de dolor abdominal durante el embarazo. El profesional que la asiste deber  evaluarla.  Presenta dolor de cabeza intenso que no se alivia.  Tiene problemas visuales, visin doble o borrosa.  Si no siente los movimientos del beb durante ms de 1 hora. Si piensa que el beb no se mueve tanto como lo haca habitualmente, coma algo que contenga azcar y recustese sobre el lado izquierdo durante una hora. El beb debe moverse al menos 4  5 veces por hora. Comunquese inmediatamente si el beb se mueve menos que lo indicado.  Se cae, se ve involucrada en un accidente automovilstico o sufre algn tipo de traumatismo.  En su hogar hay violencia mental o fsica. Document Released: 05/07/2005 Document Revised: 01/27/2012 [employer] Patient Information 2013 [employer].  

## 2012-12-08 ENCOUNTER — Ambulatory Visit (INDEPENDENT_AMBULATORY_CARE_PROVIDER_SITE_OTHER): Payer: Self-pay | Admitting: Obstetrics and Gynecology

## 2012-12-08 VITALS — BP 111/74 | Wt 160.5 lb

## 2012-12-08 DIAGNOSIS — Z3493 Encounter for supervision of normal pregnancy, unspecified, third trimester: Secondary | ICD-10-CM

## 2012-12-08 DIAGNOSIS — O9989 Other specified diseases and conditions complicating pregnancy, childbirth and the puerperium: Secondary | ICD-10-CM

## 2012-12-08 DIAGNOSIS — Z3403 Encounter for supervision of normal first pregnancy, third trimester: Secondary | ICD-10-CM

## 2012-12-08 DIAGNOSIS — Z3480 Encounter for supervision of other normal pregnancy, unspecified trimester: Secondary | ICD-10-CM

## 2012-12-08 LAB — POCT URINALYSIS DIP (DEVICE)
Bilirubin Urine: NEGATIVE
Glucose, UA: NEGATIVE mg/dL
Nitrite: NEGATIVE
Specific Gravity, Urine: 1.025 (ref 1.005–1.030)
Urobilinogen, UA: 0.2 mg/dL (ref 0.0–1.0)

## 2012-12-08 NOTE — Progress Notes (Signed)
Doing well. Cultures done. Plans no epidural, no circ. Encouraged to breastfeed. Depo.

## 2012-12-08 NOTE — Progress Notes (Signed)
Pulse: 82

## 2012-12-08 NOTE — Patient Instructions (Signed)
Lactancia materna  (Breastfeeding) Decidir Museum/gallery exhibitions officer es una de las mejores elecciones que puede hacer por usted y su beb. La informacin que se brinda a Psychologist, clinical dar una breve visin de los beneficios de la lactancia materna as como de las dudas ms frecuentes alrededor de ella.  LOS BENEFICIOS DE AMAMANTAR  Para el beb   La primera leche (calostro ) ayuda al mejor funcionamiento del sistema digestivo del beb.   La leche tiene anticuerpos que provienen de la madre y que ayudan a prevenir las infecciones en el beb.   El beb tiene una menor incidencia de asma, alergias y del sndrome de muerte sbita del lactante (SMSL).   Los nutrientes en la [place] materna son mejores para el beb que los preparados para lactantes y la [place] materna ayuda a un mejor desarrollo del cerebro del beb.   Los bebs amamantados tienen menos gases, clicos y estreimiento.  Para la mam   La lactancia materna favorece el desarrollo de un vnculo muy especial entre la madre y el beb.   Es ms conveniente, siempre disponible y a Landscape architect y Film/video editor.   Consume caloras en la madre y la ayuda a perder el peso ganado durante el [place].   Favorece la contraccin del tero a su tamao normal, de manera ms rpida y [employer] las hemorragias luego del [place].   Las [employer] que amamantan tienen menor riesgo de Geophysical data processor de mama.  FRECUENCIA DEL AMAMANTAMIENTO   Un beb sano, nacido a trmino, puede amamantarse con tanta frecuencia como cada hora, o espaciar las comidas cada tres horas.   Observe al beb cuando manifieste signos de hambre. Amamante a su beb si muestra signos de hambre. Esta frecuencia variar de un beb a otro.   Amamntelo tan seguido como el beb lo solicite, o cuando usted sienta la necesidad de Paramedic sus [place].   Despierte al beb si han pasado 3  4 horas desde la ltima comida.   El amamantamiento frecuente la ayudar a producir ms  [place] y a Education officer, community de Engineer, mining en los pezones e hinchazn de las [place].  POSICIN DEL BEBE PARA EL AMAMANTAMIENTO   Ya sea que se encuentre [place] o sentada, asegrese que el abdomen del beb enfrente el suyo.   Sostenga la mama con el pulgar por arriba y los otros 4 dedos por debajo. Asegrese que sus dedos se encuentren lejos del pezn y de la boca del beb.   Empuje suavemente los labios del beb con el pezn o con el dedo.   Cuando la boca del beb se abra lo suficiente, introduzca el pezn y la areola tanto como le sea posible dentro de la boca.   Coloque al beb cerca suyo de modo que su nariz y mejillas toquen las mamas al [employer].  ALIMENTACIN Y SUCCIN   La duracin de cada comida vara de un beb a otro y de Neomia Dear comida a [place].   El beb debe succionar entre 2 y 3 minutos para que Development worker, international aid. Esto se denomina "bajada". Por este motivo, permita que el nio se alimente en cada mama todo lo que desee. Terminar de mamar cuando haya recibido la cantidad [place] de nutrientes.   Para detener la succin coloque su dedo en la comisura de la boca del nio y Midwife entre sus encas antes de quitarle la mama de la boca. Esto la ayudar a English as a second language teacher.  COMO SABER SI EL BEB OBTIENE LA  SUFICIENTE LECHE MATERNA  Preguntarse si el beb obtiene la cantidad suficiente de [place] es una preocupacin frecuente [employer]. Puede asegurarse que el beb tiene la leche suficiente si:   El beb succiona activamente y usted escucha que traga .   El beb parece estar relajado y satisfecho despus de Psychologist, clinical.   El nio se alimenta al menos 8 a 12 veces en 24 horas. Alimntelo hasta que se desprenda por sus propios medios o se quede dormido en la primera mama (al menos durante 10 a 20 minutos), luego ofrzcale el otro lado.   El beb moja 5 a 6 paales desechables (6 a 8 paales de tela) en 24 horas cuando tiene 5  6 das de vida.   Tiene al [employer] 3 a 4  deposiciones todos los [employer] primeros meses. La materia fecal debe ser blanda y [place].   El beb debe aumentar 4 a 6 libras (120 a 170 gr.) por semana despus de los 4 [address] de vida.   Siente que las mamas se ablandan despus de amamantar  REDUCIR LA CONGESTIN DE LAS MAMAS   Durante la primera semana despus del [place], usted puede experimentar hinchazn en las mamas. Cuando las mamas estn congestionadas, se sienten calientes, llenas y molestas al tacto. Puede reducir la congestin si:   Lo amamanta frecuentemente, cada 2-3 horas. Las mams que [employer] pronto y con frecuencia tienen menos problemas de [place].   Coloque compresas de hielo en sus mamas durante 10-20 minutos entre cada amamantamiento. Esto ayuda a Building services engineer. Envuelva las bolsas de hielo en una toalla liviana para proteger su piel. Las bolsas de vegetales congelados funcionan bien para este propsito.   Tome una ducha tibia o aplique compresas hmedas calientes en las mamas durante 5 a 10 minutos antes de cada vez que [place]. Esto aumenta la circulacin y [place] a que la [place].   Masajee suavemente la mama antes y Psychologist, sport and exercise. Con las puntas de los dedos, masajee desde la pared torcica hacia abajo hasta llegar al pezn, con movimientos circulares.   Asegrese que el nio vaca al menos una mama antes de cambiar de lado.   Use un sacaleche para vaciar la mama si el beb se duerme o no se alimenta bien. Tambin podr [employer] con esa bomba si tiene que volver al trabajo o siente que las mamas estn congestionadas.   Evite los biberones, chupetes o complementar la alimentacin con agua o jugos en lugar de la [place]. La leche materna es todo el alimento que el beb necesita. No es necesario que el nio ingiera agua o preparados de bibern. Louann Liv, es lo mejor para ayudar a que las mamas produzcan ms [place]. no darle suplementos al [employer] las primeras  semanas.   Verifique que el beb se encuentra en la posicin correcta mientras lo alimenta.   Use un sostn que soporte bien sus mamas y [employer] que tienen aro.   Consuma una dieta balanceada y beba lquidos en cantidad.   Descanse con frecuencia, reljese y tome sus vitaminas prenatales para evitar la fatiga, el estrs y la anemia.  Si sigue estas indicaciones, la congestin debe mejorar en 24 a 48 horas. Si an tiene dificultades, consulte a Barista.  CUDESE USTED MISMA  Cuide sus mamas.   Bese o dchese diariamente.   Evite usar [employer].   Comience a amamantar del lado izquierdo en una comida  y del lado derecho en la siguiente.   Notar que aumenta el flujo de [place] a los 2 a 5 [address] despus del [address]. Puede sentir algunas molestias por la congestin, lo que hace que sus mamas estn duras y sensibles. La congestin disminuye en 24 a 48 horas. Mientras tanto, aplique toallas hmedas calientes durante 5 a 10 minutos antes de amamantar. Un masaje suave y la extraccin de un poco de leche antes de Museum/gallery exhibitions officer ablandarn las mamas y har ms fcil que el beb se agarre.   Use un buen sostn y seque al aire los pezones durante 3 a 4 minutos luego de Wellsite geologist.   Solo utilice apsitos de algodn.   Utilice lanolina [employer] pezones luego de [place]. No necesita lavarlos luego de alimentar al [employer]. Otra opcin es exprimir algunas gotas de [place] y [employer] pezones.  Cumpla con estos cuidados   Consuma alimentos bien balanceados y refrigerios nutritivos.   Dixie Dials, jugos de fruta y agua para Warehouse manager sed (alrededor de 8 vasos por Futures trader).   Descanse lo suficiente.  Evite los alimentos que usted note que pueden afectar al beb.  SOLICITE ATENCIN MDICA SI:   Tiene dificultad con la lactancia materna y [place].   Tiene una zona de color rojo, dura y dolorosa en la mama que se acompaa de [place].    El beb est muy somnoliento como para alimentarse bien o tiene problemas para dormir.   Su beb moja menos de 6 paales al [address], a los 5 [address]  [address] de vida.   La piel del beb o la parte blanca de sus ojos est ms amarilla de lo que estaba en el hospital.   Se siente deprimida.  Document Released: 07/28/2005 Document Revised: 01/27/2012 [facility] Patient Information 2013 [place], [place].

## 2012-12-08 NOTE — Addendum Note (Signed)
Addended by: Franchot Mimes on: 12/08/2012 09:21 AM   Modules accepted: Orders

## 2012-12-09 LAB — GC/CHLAMYDIA PROBE AMP
CT Probe RNA: NEGATIVE
GC Probe RNA: NEGATIVE

## 2012-12-11 LAB — CULTURE, BETA STREP (GROUP B ONLY)

## 2012-12-13 ENCOUNTER — Encounter (HOSPITAL_COMMUNITY): Payer: Self-pay | Admitting: *Deleted

## 2012-12-13 ENCOUNTER — Inpatient Hospital Stay (HOSPITAL_COMMUNITY)
Admission: AD | Admit: 2012-12-13 | Discharge: 2012-12-16 | DRG: 775 | Disposition: A | Discharge status: Home / Self Care | Payer: Medicaid Other | Source: Ambulatory Visit | Attending: Family Medicine | Admitting: Family Medicine

## 2012-12-13 DIAGNOSIS — O4290 Premature rupture of membranes, unspecified as to length of time between rupture and onset of labor, unspecified weeks of gestation: Principal | ICD-10-CM | POA: Diagnosis present

## 2012-12-13 HISTORY — DX: Other specified health status: Z78.9

## 2012-12-13 LAB — CBC
MCH: 31 pg (ref 26.0–34.0)
MCHC: 34.7 g/dL (ref 30.0–36.0)
MCV: 89.6 fL (ref 78.0–100.0)
Platelets: 167 10*3/uL (ref 150–400)
RBC: 4.22 MIL/uL (ref 3.87–5.11)
RDW: 13.5 % (ref 11.5–15.5)

## 2012-12-13 MED ORDER — TERBUTALINE SULFATE 1 MG/ML IJ SOLN: 0.2500 mg | Freq: Once | INTRAMUSCULAR | Status: AC | PRN

## 2012-12-13 MED ORDER — IBUPROFEN 600 MG PO TABS: 600.0000 mg | ORAL_TABLET | Freq: Four times a day (QID) | ORAL | Status: DC | PRN

## 2012-12-13 MED ORDER — CITRIC ACID-SODIUM CITRATE 334-500 MG/5ML PO SOLN: 30.0000 mL | ORAL | Status: DC | PRN

## 2012-12-13 MED ORDER — ONDANSETRON HCL 4 MG/2ML IJ SOLN: 4.0000 mg | Freq: Four times a day (QID) | INTRAMUSCULAR | Status: DC | PRN

## 2012-12-13 MED ORDER — OXYTOCIN 40 UNITS IN LACTATED RINGERS INFUSION - SIMPLE MED
1.0000 m[IU]/min | INTRAVENOUS | Status: DC
  Administered 2012-12-13: 2 m[IU]/min via INTRAVENOUS
  Administered 2012-12-14: 6 m[IU]/min via INTRAVENOUS

## 2012-12-13 MED ORDER — LACTATED RINGERS IV SOLN: 500.0000 mL | INTRAVENOUS | Status: DC | PRN

## 2012-12-13 MED ORDER — LIDOCAINE HCL (PF) 1 % IJ SOLN
30.0000 mL | INTRAMUSCULAR | Status: DC | PRN
  Filled 2012-12-13 (×2): qty 30

## 2012-12-13 MED ORDER — OXYTOCIN BOLUS FROM INFUSION: 500.0000 mL | INTRAVENOUS | Status: DC

## 2012-12-13 MED ORDER — NALBUPHINE SYRINGE 5 MG/0.5 ML
10.0000 mg | INJECTION | INTRAMUSCULAR | Status: DC | PRN
  Filled 2012-12-13: qty 1

## 2012-12-13 MED ORDER — FENTANYL CITRATE 0.05 MG/ML IJ SOLN: 100.0000 ug | INTRAMUSCULAR | Status: DC | PRN

## 2012-12-13 MED ORDER — OXYTOCIN 40 UNITS IN LACTATED RINGERS INFUSION - SIMPLE MED
62.5000 mL/h | INTRAVENOUS | Status: DC
  Filled 2012-12-13: qty 1000

## 2012-12-13 MED ORDER — OXYCODONE-ACETAMINOPHEN 5-325 MG PO TABS
1.0000 | ORAL_TABLET | ORAL | Status: DC | PRN
  Administered 2012-12-14: 2 via ORAL
  Filled 2012-12-13: qty 2

## 2012-12-13 MED ORDER — ACETAMINOPHEN 325 MG PO TABS: 650.0000 mg | ORAL_TABLET | ORAL | Status: DC | PRN

## 2012-12-13 MED ORDER — LACTATED RINGERS IV SOLN
INTRAVENOUS | Status: DC
  Administered 2012-12-13: 22:00:00 via INTRAVENOUS

## 2012-12-13 MED ORDER — FLEET ENEMA 7-19 GM/118ML RE ENEM: 1.0000 | ENEMA | RECTAL | Status: DC | PRN

## 2012-12-13 NOTE — H&P (Signed)
Morgan Campos is a 27 y.o. female G2P1001 at [redacted]w[redacted]d presenting for ROM at 2 pm.  Small amt of fluid out, but has continued to leak since then and even more fluid coming out now. Only occasional contraction. No bleeding. Baby moving well.  [facility]atal care at [facility]. No complications of pregnancy. No prior preterm deliveries. First pregnancy with term SVD.   Maternal Medical History:  Reason for admission: Rupture of membranes.  Nausea.  Contractions: Onset was 6-12 hours ago.    Fetal activity: Perceived fetal activity is normal.   Last perceived fetal movement was within the past hour.    Prenatal complications: no prenatal complications Prenatal Complications - Diabetes: none.    OB History   Grav Para Term Preterm Abortions TAB SAB Ect Mult Living   2 1 1       1      Past Medical History  Diagnosis Date  . No pertinent past medical history   . Medical history non-contributory    Past Surgical History  Procedure Laterality Date  . Appendectomy     Family History: family history is not on file. Social History:  reports that she has never smoked. She has never used smokeless tobacco. She reports that she does not drink alcohol or use illicit drugs.   Prenatal Transfer Tool  Maternal Diabetes: No Genetic Screening: Normal Maternal Ultrasounds/Referrals: Normal Fetal Ultrasounds or other Referrals:  None Maternal Substance Abuse:  No Significant Maternal Medications:  None Significant Maternal Lab Results:  Lab values include: Group B Strep negative Other Comments:  None  Review of Systems  Constitutional: Negative for fever and chills.  Eyes: Negative for blurred vision and double vision.  Gastrointestinal: Negative for nausea, vomiting and diarrhea.  Genitourinary: Negative for dysuria.  Neurological: Negative for headaches.    Dilation: 1.5 Effacement (%): 40 Station: -2 Exam by:: Thad Ranger MD Blood pressure 121/74, pulse 82, temperature 98.8 F (37.1 C),  temperature source Oral, resp. rate 16, height 5\' 2"  (1.575 m), weight 74.118 kg (163 lb 6.4 oz), last menstrual period 03/30/2012. Maternal Exam:  Abdomen: Fetal presentation: vertex  Introitus: Normal vulva. Vagina is positive for vaginal discharge (clear amniotic fluid).  Ferning test: positive.  Amniotic fluid character: clear. Fluid leaking from vagina - fern positive. Cervix 1.5.-2/40/-2  Pelvis: adequate for delivery.   Cervix: Cervix evaluated by digital exam.     Fetal Exam Fetal Monitor Review: Mode: ultrasound.   Baseline rate: 150.  Variability: moderate (6-25 bpm).   Pattern: accelerations present and no decelerations.    Fetal State Assessment: Category I - tracings are normal.     Physical Exam  Constitutional: She is oriented to person, place, and time. She appears well-developed and well-nourished.  HENT:  Head: Normocephalic and atraumatic.  Eyes: Conjunctivae and EOM are normal.  Neck: Normal range of motion. Neck supple.  Cardiovascular: Normal rate.   Respiratory: Effort normal. No respiratory distress.  GI: Soft. There is no tenderness. There is no rebound.  Genitourinary: Vaginal discharge (clear amniotic fluid) found.  Musculoskeletal: Normal range of motion. She exhibits no edema and no tenderness.  Neurological: She is alert and oriented to person, place, and time.  Skin: Skin is warm and dry.  Psychiatric: She has a normal mood and affect.    Prenatal labs: ABO, Rh: O/Positive/-- (10/30 0000) Antibody: NEG (02/26 0851) Rubella: Immune (10/30 0000) RPR: NON REAC (02/26 0851)  HBsAg: Negative (10/30 0000)  HIV: NON REACTIVE (02/26 0851)  GBS:  negative  Assessment/Plan: - Admit to L&D.  - GBS negative - Nubain/fentanyl for pain. Epidural when desired - Will start pitocin if no contractions in next 2-3 hours - Anticipate SVD  Napoleon Form 12/13/2012, 8:23 PM

## 2012-12-13 NOTE — Progress Notes (Signed)
Patient ID: Morgan Campos, female   DOB: 1986/04/01, 27 y.o.   MRN: [id number]  S:  Pt continues leaking fluid. Irregular contractions, no very uncomfortabl  O:  Filed Vitals:   12/13/12 2022 12/13/12 2135 12/13/12 2309 12/13/12 2329  BP: 121/74 118/77 121/68   Pulse: 82 71 75   Temp: 98.8 F (37.1 C) 98.1 F (36.7 C)  97.3 F (36.3 C)  TempSrc: Oral Oral  Axillary  Resp: 16 18 18    Height:      Weight:        Cervix:  1.5-2/40/-2, posterior  FHTs:  140, moderate variability, accels present no decels TOCO:  Occasional contraction  A/P 27 y.o. G2P1001 at [redacted]w[redacted]d with PPROM 9 hrs ago.  - No cervical change, irregular contractions - Start pitocin - FHTs category I - Anticipate SVD  Napoleon Form, MD

## 2012-12-13 NOTE — MAU Note (Signed)
Pt G2 P1 at 36.6wks leaking clear fluid since 1400.  Denies contractions.

## 2012-12-14 ENCOUNTER — Encounter (HOSPITAL_COMMUNITY): Payer: Self-pay

## 2012-12-14 DIAGNOSIS — O4290 Premature rupture of membranes, unspecified as to length of time between rupture and onset of labor, unspecified weeks of gestation: Secondary | ICD-10-CM

## 2012-12-14 LAB — RPR: RPR Ser Ql: NONREACTIVE

## 2012-12-14 MED ORDER — BENZOCAINE-MENTHOL 20-0.5 % EX AERO: 1.0000 "application " | INHALATION_SPRAY | CUTANEOUS | Status: DC | PRN

## 2012-12-14 MED ORDER — ONDANSETRON HCL 4 MG/2ML IJ SOLN: 4.0000 mg | INTRAMUSCULAR | Status: DC | PRN

## 2012-12-14 MED ORDER — OXYCODONE-ACETAMINOPHEN 5-325 MG PO TABS
1.0000 | ORAL_TABLET | ORAL | Status: DC | PRN
  Administered 2012-12-14: 2 via ORAL
  Administered 2012-12-15: 1 via ORAL
  Filled 2012-12-14: qty 1
  Filled 2012-12-14: qty 2

## 2012-12-14 MED ORDER — BISACODYL 10 MG RE SUPP: 10.0000 mg | Freq: Every day | RECTAL | Status: DC | PRN

## 2012-12-14 MED ORDER — PRENATAL MULTIVITAMIN CH
1.0000 | ORAL_TABLET | Freq: Every day | ORAL | Status: DC
  Administered 2012-12-14 – 2012-12-16 (×3): 1 via ORAL
  Filled 2012-12-14 (×3): qty 1

## 2012-12-14 MED ORDER — TETANUS-DIPHTH-ACELL PERTUSSIS 5-2.5-18.5 LF-MCG/0.5 IM SUSP
0.5000 mL | Freq: Once | INTRAMUSCULAR | Status: AC
  Administered 2012-12-14: 0.5 mL via INTRAMUSCULAR

## 2012-12-14 MED ORDER — ONDANSETRON HCL 4 MG PO TABS: 4.0000 mg | ORAL_TABLET | ORAL | Status: DC | PRN

## 2012-12-14 MED ORDER — SIMETHICONE 80 MG PO CHEW: 80.0000 mg | CHEWABLE_TABLET | ORAL | Status: DC | PRN

## 2012-12-14 MED ORDER — LANOLIN HYDROUS EX OINT: TOPICAL_OINTMENT | CUTANEOUS | Status: DC | PRN

## 2012-12-14 MED ORDER — IBUPROFEN 600 MG PO TABS
600.0000 mg | ORAL_TABLET | Freq: Four times a day (QID) | ORAL | Status: DC
  Administered 2012-12-14 – 2012-12-16 (×10): 600 mg via ORAL
  Filled 2012-12-14 (×9): qty 1

## 2012-12-14 MED ORDER — ZOLPIDEM TARTRATE 5 MG PO TABS: 5.0000 mg | ORAL_TABLET | Freq: Every evening | ORAL | Status: DC | PRN

## 2012-12-14 MED ORDER — FERROUS SULFATE 325 (65 FE) MG PO TABS
325.0000 mg | ORAL_TABLET | Freq: Two times a day (BID) | ORAL | Status: DC
  Administered 2012-12-14 – 2012-12-16 (×5): 325 mg via ORAL
  Filled 2012-12-14 (×5): qty 1

## 2012-12-14 MED ORDER — OXYTOCIN 40 UNITS IN LACTATED RINGERS INFUSION - SIMPLE MED: 62.5000 mL/h | INTRAVENOUS | Status: DC | PRN

## 2012-12-14 MED ORDER — DIPHENHYDRAMINE HCL 25 MG PO CAPS: 25.0000 mg | ORAL_CAPSULE | Freq: Four times a day (QID) | ORAL | Status: DC | PRN

## 2012-12-14 MED ORDER — MEASLES, MUMPS & RUBELLA VAC ~~LOC~~ INJ
0.5000 mL | INJECTION | Freq: Once | SUBCUTANEOUS | Status: DC
  Filled 2012-12-14: qty 0.5

## 2012-12-14 MED ORDER — FLEET ENEMA 7-19 GM/118ML RE ENEM: 1.0000 | ENEMA | Freq: Every day | RECTAL | Status: DC | PRN

## 2012-12-14 MED ORDER — SENNOSIDES-DOCUSATE SODIUM 8.6-50 MG PO TABS: 2.0000 | ORAL_TABLET | Freq: Every day | ORAL | Status: DC

## 2012-12-14 MED ORDER — WITCH HAZEL-GLYCERIN EX PADS: 1.0000 "application " | MEDICATED_PAD | CUTANEOUS | Status: DC | PRN

## 2012-12-14 MED ORDER — DIBUCAINE 1 % RE OINT: 1.0000 "application " | TOPICAL_OINTMENT | RECTAL | Status: DC | PRN

## 2012-12-14 NOTE — Progress Notes (Signed)
UR completed 

## 2012-12-14 NOTE — H&P (Signed)
Chart reviewed and agree with management and plan.  

## 2012-12-15 ENCOUNTER — Encounter: Payer: Self-pay | Admitting: Advanced Practice Midwife

## 2012-12-15 NOTE — Progress Notes (Signed)
Post Partum Day #1 Subjective: no complaints, up ad lib and tolerating PO; breast and bottle; plans Depo for contraception; declines early d/c  Objective: Blood pressure 113/75, pulse 71, temperature 98 F (36.7 C), temperature source Oral, resp. rate 18, height 5\' 2"  (1.575 m), weight 163 lb 6.4 oz (74.118 kg), last menstrual period 03/30/2012, SpO2 96.00%, unknown if currently breastfeeding.  Physical Exam:  General: alert, cooperative and no distress Lungs: nl effort Heart: RRR Lochia: appropriate Uterine Fundus: firm DVT Evaluation: No evidence of DVT seen on physical exam.   Recent Labs  12/13/12 2120  HGB 13.1  HCT 37.8    Assessment/Plan: Plan for discharge tomorrow   LOS: 2 days   Cam Hai 12/15/2012, 7:53 AM

## 2012-12-16 MED ORDER — IBUPROFEN 600 MG PO TABS: 600.0000 mg | ORAL_TABLET | Freq: Four times a day (QID) | ORAL | Status: DC | PRN

## 2012-12-16 NOTE — Discharge Summary (Signed)
Obstetric Discharge Summary Reason for Admission: rupture of membranes Prenatal Procedures: NST Intrapartum Procedures: spontaneous vaginal delivery Postpartum Procedures: none Complications-Operative and Postpartum: none Hemoglobin  Date Value Range Status  12/13/2012 13.1  12.0 - 15.0 g/dL Final  96/11/5407 81.1   Final     HCT  Date Value Range Status  12/13/2012 37.8  36.0 - 46.0 % Final  06/09/2012 39   Final  Hospital Course: Morgan Campos is a 27 y.o. female G2P1001 at [redacted]w[redacted]d presenting for ROM at 2 pm. Small amt of fluid out, but has continued to leak since then and even more fluid coming out now. Only occasional contraction. No bleeding. Baby moving well.  P[facility]atal [facility] at [facility]. No complications of pregnancy. No prior preterm deliveries. First pregnancy with term SVD.  Delivery Note  At 3:58 AM a viable female was delivered via Vaginal, Spontaneous Delivery (Presentation: Left Occiput Anterior). APGAR: 7, 9; weight 7 lb 7.6 oz (3391 g).  Placenta status: Intact, Spontaneous. Cord: 3 vessels with the following complications: None. Cord pH: n/a  Anesthesia: None  Episiotomy: None  Lacerations: None  Suture Repair: n/a  Est. Blood Loss (mL): 300  Mom to postpartum. Baby to nursery-stable.  Napoleon Form  12/14/2012, 4:46 AM She and baby have done well postpartum. She plans breastfeeding and depo provera.  Physical Exam:  General: alert, cooperative and no distress Lochia: appropriate Uterine Fundus: firm Incision: healing well DVT Evaluation: No evidence of DVT seen on physical exam.  Discharge Diagnoses: Term Pregnancy-delivered  Discharge Information: Date: 12/16/2012 Activity: unrestricted and pelvic rest Diet: routine Medications: PNV and Ibuprofen Condition: stable Instructions: refer to practice specific booklet Discharge to: home   Newborn Data: Live born female  Birth Weight: 7 lb 7.6 oz (3391 g) APGAR: 7, 9  Home with  mother.  [facility] 12/16/2012, 7:44 AM

## 2013-01-07 ENCOUNTER — Encounter: Payer: Self-pay | Admitting: *Deleted

## 2013-01-12 ENCOUNTER — Ambulatory Visit: Payer: Self-pay | Admitting: Advanced Practice Midwife

## 2013-01-17 ENCOUNTER — Ambulatory Visit (INDEPENDENT_AMBULATORY_CARE_PROVIDER_SITE_OTHER): Payer: Self-pay | Admitting: Medical

## 2013-01-17 ENCOUNTER — Encounter: Payer: Self-pay | Admitting: Medical

## 2013-01-17 NOTE — Patient Instructions (Addendum)
Medroxyprogesterone injection [Contraceptive] Qu es este medicamento? Las inyecciones anticonceptivas de MEDROXIPROGESTERONA previenen el embarazo. Las inyecciones le brindarn control de la natalidad durante 3 meses. La Depo-subQ Provera 104 se utiliza tambin para tratar el dolor relacionado con endometriosis. Este medicamento puede ser utilizado para otros usos; si tiene alguna pregunta consulte con su proveedor de atencin mdica o con su farmacutico. Qu le debo informar a mi profesional de la salud antes de tomar este medicamento? Necesita saber si usted presenta alguno de los siguientes problemas o situaciones: -si consume alcohol con frecuencia -asma -enfermedad vascular o antecedente de cogulos sanguneos en los pulmones o las piernas -enfermedad de los huesos, como osteoporosis -cncer de mama -diabetes -trastornos de la alimentacin (anorexia nerviosa o bulimia) -alta presin sangunea -infecciones por VIH o SIDA -enfermedad renal -enfermedad heptica -depresin mental -migraa -convulsiones -derrame cerebral -fuma tabaco -sangrado vaginal -una reaccin alrgica o inusual a la medroxiprogesterona, otras hormonas, otros medicamentos, alimentos, colorantes o conservantes -si est embarazada o buscando quedar embarazada -si est amamantando a un beb Cmo debo utilizar este medicamento? El anticonceptivo de Depo-Provera se inyecta por va intramuscular. La Depo-SubQ Provera 104 se inyecta por va subcutnea. Las administra un profesional de la salud. Usted no puede estar embarazada antes de recibir una inyeccin. La inyeccin normalmente se aplica durante los primeros 5 das despus de comenzar un perodo menstrual o 6 semanas despus de un parto. Hable con su pediatra para informarse acerca del uso de este medicamento en nios. Puede requerir atencin especial. Estas inyecciones han sido usadas en nias que han empezados a tener perodos menstruales. Sobredosis: Pngase en  contacto inmediatamente con un centro toxicolgico o una sala de urgencia si usted cree que haya tomado demasiado medicamento. ATENCIN: Este medicamento es solo para usted. No comparta este medicamento con nadie. Qu sucede si me olvido de una dosis? Trate de no olvidar ninguna dosis. Para mantener el control de natalidad necesitar una inyeccin cada 3 meses. Si no puede asistir a una cita, comunquese con su profesional de la salud para que se la cambie. Si espera ms de 13 semanas entre las inyecciones anticonceptivos de Depo-Provera o ms de 14 semanas entre inyecciones anticonceptivos de Depo-subQ Provera 104, puede quedarse embarazada. Si no puede asistir a su cita utilice otro mtodo anticonceptivo. Tal vez deba hacerse una prueba de embarazo antes de recibir otra inyeccin. Qu puede interactuar con este medicamento? No tome esta medicina con ninguno de los siguientes medicamentos: -bosentano Esta medicina tambin puede interactuar con los siguientes medicamentos: -aminoglutethimide -antibiticos o medicamentos para infecciones, especialmente rifampicina, rifabutina, rifapentina y griseofulvina -aprepitant -barbitricos, tales como el fenobarbital o primidona -bexaroteno -carbamazepina -medicamentos para convulsiones, tales como etotona, felbamato, oxcarbazepina, fenitona, topiramato -modafinilo -hierba de [place] Puede ser que esta lista no menciona todas las posibles interacciones. Informe a su profesional de la salud de todos los productos a base de hierbas, medicamentos de venta libre o suplementos nutritivos que est tomando. Si usted fuma, consume bebidas alcohlicas o si utiliza drogas ilegales, indqueselo tambin a su profesional de la salud. Algunas sustancias pueden interactuar con su medicamento. A qu debo estar atento al usar este medicamento? Este medicamento no la protege de la infeccin por VIH (SIDA) ni de otras enfermedades de transmisin sexual. El uso de este  producto puede provocar una prdida de calcio de sus huesos. La prdida de calcio puede provocar huesos dbiles (osteoporosis). Slo use este producto durante ms de 2 aos si otras formas de anticonceptivos no son apropiados para usted.   Mientre ms tiempo use este producto para el control de la natalidad, tendr ms riesgo de padecer de huesos dbiles. Consulte a su profesional de la salud acerca de cmo puede mantener los huesos fuertes. Puede experimentar un cambio en el patrn de sangrado o periodos irregulares. Muchas mujeres dejan de tener periodos mientras usan este medicamento. Si recibe sus inyecciones a tiempo, la posibilidad de quedarse embarazada es muy baja. Si cree que podr estar embarazada, visite a su profesional de la salud lo antes posible. Si desea quedar embarazada dentro del prximo ao, informe a su profesional de la salud. El efecto de este medicamento puede perdurar durante mucho tiempo despus de recibir su ltima inyeccin. Qu efectos secundarios puedo tener al utilizar este medicamento? Efectos secundarios que debe informar a su mdico o a su profesional de la salud tan pronto como sea posible: -reacciones alrgicas como erupcin cutnea, picazn o urticarias, hinchazn de la cara, labios o lengua -secreciones o sensibilidad de las mamas -problemas respiratorios -cambios en la visin -depresin -sensacin de desmayos o mareos, cadas -fiebre -dolor en el abdomen, pecho, entrepierna o piernas -problemas de coordinacin, del habla, al caminar -cansancio o debilidad inusual -color amarillento de los ojos o la piel Efectos secundarios que, por lo general, no requieren atencin mdica (debe informarlos a mdico o a su profesional de la salud si persisten o si son molestos): -cne -retencin de lquidos e hinchazn -dolor de cabeza -perodos menstruales irregulares, manchando o ausencia de perodos menstruales -dolor, picazn o reaccin cutnea temporal en el lugar de la  inyeccin -aumento de peso Puede ser que esta lista no menciona todos los posibles efectos secundarios. Comunquese a su mdico por asesoramiento mdico sobre los efectos secundarios. Usted puede informar los efectos secundarios a la FDA por telfono al 1-800-FDA-1088. Dnde debo guardar mi medicina? No se aplica en este caso. Un profesional de la salud le administrar las inyecciones. ATENCIN: Este folleto es un resumen. Puede ser que no cubra toda la posible informacin. Si usted tiene preguntas acerca de esta medicina, consulte con su mdico, su farmacutico o su profesional de la salud.  2013, [employer]. (10/09/2008 3:09:00 PM)  

## 2013-01-17 NOTE — Progress Notes (Signed)
Patient ID: Tyrell Brereton, female   DOB: January 25, 1986, 27 y.o.   MRN: [id number]  Subjective:     Katasha Riga is a 27 y.o. female who presents for a postpartum visit. She is 5 weeks postpartum following a spontaneous vaginal delivery. I have fully reviewed the prenatal and intrapartum course. The delivery was at 37.0 gestational weeks. Outcome: spontaneous vaginal delivery. Anesthesia: none. Postpartum course has been normal. Baby's course has been normal. Baby is feeding by both breast and bottle Rush Barer with iron. Bleeding no bleeding. Bowel function is normal. Bladder function is normal. Patient is not sexually active. Contraception method is none. Postpartum depression screening: negative.  The following portions of the patient's history were reviewed and updated as appropriate: allergies, current medications, past family history, past medical history, past social history, past surgical history and problem list.  Review of Systems Pertinent items are noted in HPI.   Objective:    BP 110/77  Pulse 71  Wt 137 lb 12.8 oz (62.506 kg)  BMI 25.2 kg/m2  Breastfeeding? Yes  General:  alert and cooperative   Breasts:  not performed  Lungs: clear to auscultation bilaterally  Heart:  regular rate and rhythm, S1, S2 normal, no murmur, click, rub or gallop  Abdomen: soft, non-tender; bowel sounds normal; no masses,  no organomegaly   Vulva:  normal  Vagina: normal vagina, no discharge, exudate, lesion, or erythema  Cervix:  no cervical motion tenderness, no lesions and normal contour, + friability, no active bleeding  Corpus: normal size, contour, position, consistency, mobility, non-tender  Adnexa:  no mass, fullness, tenderness  Rectal Exam: Not performed.        Assessment:     Normal postpartum exam. Pap smear not done at today's visit.   Plan:    1. Contraception: none 2. Patient plans to start Depo Provera at PCP in [place] as this will be less expensive than  here 3. Follow up in: 1 year or as needed.

## 2014-06-12 ENCOUNTER — Encounter: Payer: Self-pay | Admitting: Medical

## 2016-03-06 ENCOUNTER — Other Ambulatory Visit (HOSPITAL_COMMUNITY): Payer: Self-pay | Admitting: *Deleted

## 2016-03-06 DIAGNOSIS — R229 Localized swelling, mass and lump, unspecified: Principal | ICD-10-CM

## 2016-03-06 DIAGNOSIS — IMO0002 Reserved for concepts with insufficient information to code with codable children: Secondary | ICD-10-CM

## 2016-03-18 ENCOUNTER — Other Ambulatory Visit (HOSPITAL_COMMUNITY): Payer: Self-pay

## 2016-03-18 ENCOUNTER — Ambulatory Visit (HOSPITAL_COMMUNITY)
Admission: RE | Admit: 2016-03-18 | Discharge: 2016-03-18 | Disposition: A | Discharge status: Home / Self Care | Payer: PRIVATE HEALTH INSURANCE | Source: Ambulatory Visit | Attending: *Deleted | Admitting: *Deleted

## 2016-03-18 DIAGNOSIS — N63 Unspecified lump in breast: Secondary | ICD-10-CM | POA: Diagnosis present

## 2016-03-18 DIAGNOSIS — IMO0002 Reserved for concepts with insufficient information to code with codable children: Secondary | ICD-10-CM

## 2016-03-18 DIAGNOSIS — R229 Localized swelling, mass and lump, unspecified: Secondary | ICD-10-CM

## 2016-08-11 NOTE — L&D Delivery Note (Signed)
Delivery Note At 12:30 PM a viable female was delivered via Vaginal, Spontaneous Delivery (Presentation: ROA ).  APGAR: 9, 9; Weight pending.   Placenta status: Delivered Intact with gentle traction.  Cord:  3 vessels with the following complications: None.  Cord pH: not collected  Anesthesia: None Episiotomy: None Lacerations: None Suture Repair: N/A Est. Blood Loss (mL): 150  Mom to postpartum.  Baby to Couplet care / Skin to Skin.  Marjie Skiff, PGY-1 12/23/2016, 12:48 PM

## 2016-09-12 ENCOUNTER — Other Ambulatory Visit: Payer: Self-pay | Admitting: Obstetrics & Gynecology

## 2016-09-12 DIAGNOSIS — Z363 Encounter for antenatal screening for malformations: Secondary | ICD-10-CM

## 2016-09-17 ENCOUNTER — Other Ambulatory Visit (HOSPITAL_COMMUNITY): Payer: Self-pay | Admitting: *Deleted

## 2016-09-17 DIAGNOSIS — N6310 Unspecified lump in the right breast, unspecified quadrant: Secondary | ICD-10-CM

## 2016-09-17 DIAGNOSIS — N6320 Unspecified lump in the left breast, unspecified quadrant: Secondary | ICD-10-CM

## 2016-09-19 ENCOUNTER — Ambulatory Visit (INDEPENDENT_AMBULATORY_CARE_PROVIDER_SITE_OTHER): Payer: Medicaid Other

## 2016-09-19 DIAGNOSIS — Z363 Encounter for antenatal screening for malformations: Secondary | ICD-10-CM | POA: Diagnosis not present

## 2016-09-19 DIAGNOSIS — Z3A25 25 weeks gestation of pregnancy: Secondary | ICD-10-CM

## 2016-09-19 NOTE — Progress Notes (Addendum)
[place] [id number] wks,cephalic,cx 3.3 cm,efw 123XX123 g,afi 10.8 cm,fhr 167 bpm,normal ov's bilat,post pl gr 0,anatomy complete,no obvious abnormalities seen,EDD 01/04/2017

## 2016-09-22 ENCOUNTER — Encounter: Payer: Self-pay | Admitting: *Deleted

## 2016-09-22 ENCOUNTER — Other Ambulatory Visit (INDEPENDENT_AMBULATORY_CARE_PROVIDER_SITE_OTHER): Payer: Self-pay | Admitting: *Deleted

## 2016-09-22 ENCOUNTER — Ambulatory Visit (INDEPENDENT_AMBULATORY_CARE_PROVIDER_SITE_OTHER): Payer: Medicaid Other | Admitting: Women's Health

## 2016-09-22 VITALS — BP 102/60 | HR 84 | Wt 130.0 lb

## 2016-09-22 DIAGNOSIS — Z1389 Encounter for screening for other disorder: Secondary | ICD-10-CM | POA: Diagnosis not present

## 2016-09-22 DIAGNOSIS — Z23 Encounter for immunization: Secondary | ICD-10-CM | POA: Diagnosis not present

## 2016-09-22 DIAGNOSIS — Z3482 Encounter for supervision of other normal pregnancy, second trimester: Secondary | ICD-10-CM | POA: Diagnosis not present

## 2016-09-22 DIAGNOSIS — O0930 Supervision of pregnancy with insufficient antenatal care, unspecified trimester: Secondary | ICD-10-CM

## 2016-09-22 DIAGNOSIS — Z3A25 25 weeks gestation of pregnancy: Secondary | ICD-10-CM

## 2016-09-22 DIAGNOSIS — O0932 Supervision of pregnancy with insufficient antenatal care, second trimester: Secondary | ICD-10-CM

## 2016-09-22 DIAGNOSIS — Z331 Pregnant state, incidental: Secondary | ICD-10-CM

## 2016-09-22 DIAGNOSIS — Z3480 Encounter for supervision of other normal pregnancy, unspecified trimester: Secondary | ICD-10-CM | POA: Insufficient documentation

## 2016-09-22 LAB — POCT URINALYSIS DIPSTICK
GLUCOSE UA: NEGATIVE
Ketones, UA: NEGATIVE
Leukocytes, UA: NEGATIVE
Nitrite, UA: NEGATIVE
Protein, UA: NEGATIVE
RBC UA: NEGATIVE

## 2016-09-22 NOTE — Progress Notes (Signed)
  Subjective:  Morgan Campos is a 31 y.o. G68P2002 Hispanic female at [redacted]w[redacted]d by 24wk u/s, being seen today for her first obstetrical visit.  Her obstetrical history is significant for term uncomplicated SVB x 2.  Pregnancy history fully reviewed.  Patient reports no complaints. Denies vb, cramping, uti s/s, abnormal/malodorous vag d/c, or vulvovaginal itching/irritation.  LMP 04/20/2016   HISTORY: OB History  Gravida Para Term Preterm AB Living  3 2 2     2   SAB TAB Ectopic Multiple Live Births          2    # Outcome Date GA Lbr Len/2nd Weight Sex Delivery Anes PTL Lv  3 Current           2 Term 12/14/12 [redacted]w[redacted]d 13:56 / 00:02 7 lb 7.6 oz (3.391 kg) M Vag-Spont None N LIV  1 Term 2010 [redacted]w[redacted]d  8 lb (3.629 kg) M Vag-Spont None N LIV     Past Medical History:  Diagnosis Date  . Medical history non-contributory   . No pertinent past medical history    Past Surgical History:  Procedure Laterality Date  . APPENDECTOMY     No family history on file.  Exam   System:     General: Well developed & nourished, no acute distress   Skin: Warm & dry, normal coloration and turgor, no rashes   Neurologic: Alert & oriented, normal mood   Cardiovascular: Regular rate & rhythm   Respiratory: Effort & rate normal, LCTAB, acyanotic   Abdomen: Soft, non tender   Extremities: normal strength, tone  Thin prep pap smear neg  2017 at [facility] FHR: 157 via doppler FH: 24cm   Assessment:   Pregnancy: [id number] Patient Active Problem List   Diagnosis Date Noted  . Encounter for supervision of other normal pregnancy 09/22/2016    100w1d G3P2002 New OB visit Late care  Plan:  Initial labs obtained Continue prenatal vitamins Problem list reviewed and updated Reviewed n/v relief measures and warning s/s to report Reviewed recommended weight gain based on pre-gravid BMI Encouraged well-balanced diet Genetic Screening discussed Quad Screen: too late Cystic fibrosis screening discussed  declined Ultrasound discussed; fetal survey: results reviewed Follow up in 3 weeks for visit and pn2 CCNC completed No email for babyscripts Flu shot today  Tawnya Crook CNM, [facility] 09/22/2016 11:23 AM

## 2016-09-22 NOTE — Patient Instructions (Addendum)
You will have your sugar test next visit.  Please do not eat or drink anything after midnight the night before you come, not even water.  You will be here for at least two hours.     Call the office [phone]) or go to [facility] if:  You begin to have strong, frequent contractions  Your water breaks.  Sometimes it is a big gush of fluid, sometimes it is just a trickle that keeps getting your panties wet or running down your legs  You have vaginal bleeding.  It is normal to have a small amount of spotting if your cervix was checked.   You don't feel your baby moving like normal.  If you don't, get you something to eat and drink and lay down and focus on feeling your baby move.   If your baby is still not moving like normal, you should call the office or go to [place] trimestre de [place] (Second Trimester of Pregnancy) El segundo trimestre va desde la semana13 hasta la 36, desde el cuarto hasta el sexto mes, y suele ser el momento en el que mejor se siente. Su organismo se ha adaptado a Public relations account executive y comienza a Print production planner. En general, las nuseas matutinas han disminuido o han desaparecido completamente, puede tener ms energa y un aumento de apetito. El segundo trimestre es tambin la poca en la que el feto se desarrolla rpidamente. Hacia el final del sexto mes, el feto mide aproximadamente 9pulgadas (23cm) y pesa alrededor de 1 libras (700g). Es probable que sienta que el beb se Software engineer (da pataditas) entre las 72 y 20semanas del Media planner. CAMBIOS EN EL ORGANISMO Su organismo atraviesa por muchos cambios durante el [place], y estos varan de Ardelia Mems mujer a Theatre manager.  Seguir [employer]. Notar que la parte baja del abdomen sobresale.  Podrn aparecer las primeras [employer] caderas, el abdomen y las [place].  Es posible que tenga dolores de cabeza que pueden aliviarse con los medicamentos que el mdico le permita tomar.  Tal vez  tenga necesidad de orinar con ms frecuencia porque el feto est ejerciendo presin Field seismologist.  Debido al Glennis Brink podr sentir Victorio Palm estomacal con frecuencia.  Puede estar estreida, ya que ciertas hormonas enlentecen los movimientos de los msculos que [employer] desechos a travs de los intestinos.  Pueden aparecer hemorroides o abultarse e hincharse las venas (venas varicosas).  Puede tener dolor de espalda que se debe al [employer] de peso y a que las hormonas del Scientist, research (life sciences) las articulaciones entre los huesos de la pelvis, y Civil Service fast streamer consecuencia de la modificacin del peso y los msculos que mantienen el equilibrio.  Las [employer] seguirn creciendo y Teaching laboratory technician.  Las Production manager y estar sensibles al cepillado y al hilo dental.  Pueden aparecer zonas oscuras o manchas (cloasma, mscara del Media planner) en el rostro que probablemente se atenuar despus del nacimiento del beb.  Es posible que se forme una lnea oscura desde el ombligo hasta la zona del pubis (linea nigra) que probablemente se atenuar despus del nacimiento del beb.  Tal vez haya cambios en el cabello que pueden incluir su engrosamiento, crecimiento rpido y cambios en la textura. Adems, a algunas mujeres se les cae el cabello durante o despus del embarazo, o tienen el cabello seco o fino. Lo ms probable es que el cabello se le normalice despus del nacimiento del beb. QU DEBE ESPERAR EN LAS CONSULTAS PRENATALES Durante una visita  prenatal de rutina:  La pesarn para asegurarse de que usted y el feto estn creciendo normalmente.  Le tomarn la presin arterial.  Le medirn el abdomen para controlar el desarrollo del beb.  Se escucharn los latidos cardacos fetales.  Se evaluarn los resultados de los estudios solicitados en visitas anteriores. El mdico puede preguntarle lo siguiente:  Cmo se siente.  Si siente los movimientos del beb.  Si ha tenido sntomas anormales, como prdida de  lquido, [place], dolores de cabeza intensos o clicos abdominales.  Si est consumiendo algn producto que contenga tabaco, como cigarrillos, tabaco de Higher education careers adviser y Psychologist, sport and exercise.  Si tiene [place]. Otros estudios que podrn realizarse durante el segundo trimestre incluyen lo siguiente:  Anlisis de sangre para detectar lo siguiente:  Concentraciones de hierro bajas (anemia).  Diabetes gestacional (entre la semana 24 y la 47).  Anticuerpos Rh.  Anlisis de orina para detectar infecciones, diabetes o protenas en la orina.  Una ecografa para confirmar que el beb crece y se desarrolla correctamente.  Una amniocentesis para diagnosticar posibles problemas genticos.  Estudios del feto para descartar espina bfida y sndrome de Down.  Prueba del VIH (virus de inmunodeficiencia humana). Los exmenes prenatales de rutina incluyen la prueba de deteccin del VIH, a menos que decida no Radiation protection practitioner. INSTRUCCIONES PARA EL CUIDADO EN EL HOGAR  Evite fumar, consumir hierbas, beber alcohol y tomar frmacos que no le hayan recetado. Estas sustancias qumicas afectan la formacin y el desarrollo del beb.  No consuma ningn producto que contenga tabaco, lo que incluye cigarrillos, tabaco de Higher education careers adviser y Psychologist, sport and exercise. Si necesita ayuda para dejar de fumar, consulte al [employer]. Puede recibir asesoramiento y otro tipo de recursos para dejar de fumar.  [place] mdico en relacin con el uso de medicamentos. Durante el embarazo, hay medicamentos que son seguros de tomar y otros que no.  Haga ejercicio solamente como se lo haya indicado el mdico. Sentir clicos uterinos es un buen signo para Ambulance person actividad fsica.  Contine comiendo alimentos sanos con regularidad.  Use un sostn que le brinde buen soporte si le [employer]strom.  No se d baos de inmersin en agua caliente, baos turcos ni saunas.  Use el cinturn de seguridad en todo momento  mientras conduce.  No coma carne cruda ni queso sin cocinar; evite el contacto con las bandejas sanitarias de los gatos y la tierra que estos animales usan. Estos elementos contienen grmenes que pueden causar defectos congnitos en el beb.  [place].  Tome entre 1500 y 2000mg  de calcio diariamente comenzando en la [id number] del embarazo [place].  Si est estreida, pruebe un laxante suave (si el mdico lo autoriza). Consuma ms alimentos ricos en fibra, como vegetales y frutas frescos y Psychologist, prison and probation services. Beba gran cantidad de lquido para mantener la orina de tono claro o color amarillo plido.  Dese baos de asiento con agua tibia para Best boy o las molestias causadas por las hemorroides. Use una crema para las hemorroides si el mdico la autoriza.  Si tiene venas varicosas, use medias de descanso. Eleve los pies durante 41minutos, 3 o 4veces por da. Limite el consumo de sal en su dieta.  No levante objetos pesados, use zapatos de tacones bajos y mantenga una buena postura.  Descanse con las piernas elevadas si tiene calambres o dolor de cintura.  Visite a su dentista si an no lo ha Quarry manager. Use un cepillo de dientes  blando para higienizarse los dientes y psese el hilo dental con suavidad.  Puede seguir [employer], a menos que el mdico le indique lo contrario.  Concurra a todas las visitas prenatales segn las indicaciones de su mdico. SOLICITE ATENCIN MDICA SI:  Natale Milch.  Siente clicos leves, presin en la pelvis o dolor persistente en el abdomen.  Tiene nuseas, vmitos o diarrea persistentes.  Margette Fast secrecin vaginal con mal olor.  Siente dolor al [employer]. SOLICITE ATENCIN MDICA DE INMEDIATO SI:  Tiene fiebre.  Tiene una prdida de lquido por la vagina.  Tiene sangrado o pequeas prdidas vaginales.  Siente dolor intenso o clicos en el abdomen.  Sube o baja de peso  rpidamente.  Tiene dificultad para respirar y siente dolor de pecho.  Sbitamente se le hinchan mucho el rostro, las [place], los tobillos, los pies o las piernas.  No ha sentido los movimientos del beb durante Leone Brand.  Siente un dolor de cabeza intenso que no se alivia con medicamentos.  Su visin se modifica. Esta informacin no tiene Marine scientist el consejo del mdico. Asegrese de hacerle al mdico cualquier pregunta que tenga. Document Released: 05/07/2005 Document Revised: 08/18/2014 Document Reviewed: 09/28/2012 Elsevier Interactive Patient Education  2017 [employer].

## 2016-09-23 LAB — MICROSCOPIC EXAMINATION
Casts: NONE SEEN /lpf
Epithelial Cells (non renal): >10 /hpf — AB (ref 0–10)
RBC, UA: NONE SEEN /hpf (ref 0–?)

## 2016-09-23 LAB — PMP SCREEN PROFILE (10S), URINE
AMPHETAMINE SCRN UR: NEGATIVE ng/mL
Barbiturate Screen, Ur: NEGATIVE ng/mL
Benzodiazepine Screen, Urine: NEGATIVE ng/mL
COCAINE(METAB.) SCREEN, URINE: NEGATIVE ng/mL
CREATININE(CRT), U: 50.9 mg/dL (ref 20.0–300.0)
Cannabinoids Ur Ql Scn: NEGATIVE ng/mL
METHADONE SCREEN, URINE: NEGATIVE ng/mL
OPIATE SCRN UR: NEGATIVE ng/mL
Oxycodone+Oxymorphone Ur Ql Scn: NEGATIVE ng/mL
PCP SCRN UR: NEGATIVE ng/mL
PROPOXYPHENE SCREEN: NEGATIVE ng/mL
Ph of Urine: 7.2 (ref 4.5–8.9)

## 2016-09-23 LAB — URINALYSIS, ROUTINE W REFLEX MICROSCOPIC
Bilirubin, UA: NEGATIVE
GLUCOSE, UA: NEGATIVE
KETONES UA: NEGATIVE
NITRITE UA: NEGATIVE
PROTEIN UA: NEGATIVE
RBC, UA: NEGATIVE
SPEC GRAV UA: 1.017 (ref 1.005–1.030)
UUROB: 1 mg/dL (ref 0.2–1.0)
pH, UA: 7 (ref 5.0–7.5)

## 2016-09-23 LAB — CBC
HEMATOCRIT: 37.3 % (ref 34.0–46.6)
Hemoglobin: 12.4 g/dL (ref 11.1–15.9)
MCH: 28.9 pg (ref 26.6–33.0)
MCHC: 33.2 g/dL (ref 31.5–35.7)
MCV: 87 fL (ref 79–97)
PLATELETS: 312 10*3/uL (ref 150–379)
RBC: 4.29 x10E6/uL (ref 3.77–5.28)
RDW: 15.1 % (ref 12.3–15.4)
WBC: 7.2 10*3/uL (ref 3.4–10.8)

## 2016-09-23 LAB — ABO/RH: Rh Factor: POSITIVE

## 2016-09-23 LAB — RUBELLA SCREEN: Rubella Antibodies, IGG: 3.23 index (ref >0.99)

## 2016-09-23 LAB — VARICELLA ZOSTER ANTIBODY, IGG

## 2016-09-23 LAB — HIV ANTIBODY (ROUTINE TESTING W REFLEX): HIV SCREEN 4TH GENERATION: NONREACTIVE

## 2016-09-23 LAB — ANTIBODY SCREEN: Antibody Screen: NEGATIVE

## 2016-09-23 LAB — HEPATITIS B SURFACE ANTIGEN: Hepatitis B Surface Ag: NEGATIVE

## 2016-09-23 LAB — RPR: RPR: NONREACTIVE

## 2016-09-24 LAB — URINE CULTURE: Organism ID, Bacteria: NO GROWTH

## 2016-09-25 ENCOUNTER — Encounter: Payer: Self-pay | Admitting: Women's Health

## 2016-09-25 DIAGNOSIS — Z283 Underimmunization status: Secondary | ICD-10-CM

## 2016-09-25 DIAGNOSIS — O09899 Supervision of other high risk pregnancies, unspecified trimester: Secondary | ICD-10-CM | POA: Insufficient documentation

## 2016-09-30 ENCOUNTER — Ambulatory Visit (HOSPITAL_COMMUNITY)
Admission: RE | Admit: 2016-09-30 | Discharge: 2016-09-30 | Disposition: A | Discharge status: Home / Self Care | Payer: PRIVATE HEALTH INSURANCE | Source: Ambulatory Visit | Attending: *Deleted | Admitting: *Deleted

## 2016-09-30 ENCOUNTER — Ambulatory Visit (HOSPITAL_COMMUNITY): Payer: PRIVATE HEALTH INSURANCE

## 2016-09-30 DIAGNOSIS — N6001 Solitary cyst of right breast: Secondary | ICD-10-CM | POA: Insufficient documentation

## 2016-09-30 DIAGNOSIS — N6310 Unspecified lump in the right breast, unspecified quadrant: Secondary | ICD-10-CM

## 2016-09-30 DIAGNOSIS — N6320 Unspecified lump in the left breast, unspecified quadrant: Secondary | ICD-10-CM | POA: Diagnosis not present

## 2016-10-13 ENCOUNTER — Encounter: Payer: Self-pay | Admitting: Women's Health

## 2016-10-13 ENCOUNTER — Other Ambulatory Visit: Payer: Self-pay

## 2016-10-13 ENCOUNTER — Ambulatory Visit (INDEPENDENT_AMBULATORY_CARE_PROVIDER_SITE_OTHER): Payer: Self-pay | Admitting: Women's Health

## 2016-10-13 VITALS — BP 100/70 | HR 80 | Wt 134.0 lb

## 2016-10-13 DIAGNOSIS — N63 Unspecified lump in unspecified breast: Secondary | ICD-10-CM | POA: Insufficient documentation

## 2016-10-13 DIAGNOSIS — Z1389 Encounter for screening for other disorder: Secondary | ICD-10-CM

## 2016-10-13 DIAGNOSIS — O0932 Supervision of pregnancy with insufficient antenatal care, second trimester: Secondary | ICD-10-CM

## 2016-10-13 DIAGNOSIS — Z131 Encounter for screening for diabetes mellitus: Secondary | ICD-10-CM

## 2016-10-13 DIAGNOSIS — Z331 Pregnant state, incidental: Secondary | ICD-10-CM

## 2016-10-13 DIAGNOSIS — Z3483 Encounter for supervision of other normal pregnancy, third trimester: Secondary | ICD-10-CM

## 2016-10-13 DIAGNOSIS — Z3A28 28 weeks gestation of pregnancy: Secondary | ICD-10-CM

## 2016-10-13 DIAGNOSIS — Z3482 Encounter for supervision of other normal pregnancy, second trimester: Secondary | ICD-10-CM

## 2016-10-13 LAB — POCT URINALYSIS DIPSTICK
Blood, UA: NEGATIVE
GLUCOSE UA: NEGATIVE
KETONES UA: NEGATIVE
LEUKOCYTES UA: NEGATIVE
Nitrite, UA: NEGATIVE
Protein, UA: NEGATIVE

## 2016-10-13 NOTE — Progress Notes (Signed)
Low-risk OB appointment [id number] [redacted]w[redacted]d Estimated Date of Delivery: 01/04/17 BP 100/70   Pulse 80   Wt 134 lb (60.8 kg)   LMP 04/20/2016   BMI 24.51 kg/m   BP, weight, and urine reviewed.  Refer to obstetrical flow sheet for FH & FHR.  Reports good fm.  Denies regular uc's, lof, vb, or uti s/s. No complaints. Reviewed ptl s/s, fkc. Recommended Tdap at HD/PCP per CDC guidelines.  Plan:  Continue routine obstetrical care  F/U in 4wks for OB appointment  PN2 today

## 2016-10-13 NOTE — Patient Instructions (Addendum)
Call the office [phone]) or go to [facility] if:  You begin to have strong, frequent contractions  Your water breaks.  Sometimes it is a big gush of fluid, sometimes it is just a trickle that keeps getting your panties wet or running down your legs  You have vaginal bleeding.  It is normal to have a small amount of spotting if your cervix was checked.   You don't feel your baby moving like normal.  If you don't, get you something to eat and drink and lay down and focus on feeling your baby move.  You should feel at least 10 movements in 2 hours.  If you don't, you should call the office or go to [facility].    Tdap Vaccine  It is recommended that you get the Tdap vaccine during the third trimester of EACH pregnancy to help protect your baby from getting pertussis (whooping cough)  27-36 weeks is the BEST time to do this so that you can pass the protection on to your baby. During pregnancy is better than after pregnancy, but if you are unable to get it during pregnancy it will be offered at the hospital.   You can get this vaccine at the health department or your family doctor  Everyone who will be around your baby should also be up-to-date on their vaccines. Adults (who are not pregnant) only need 1 dose of Tdap during adulthood.   [place] Pediatricians/Family Doctors:  [facility] [place] [phone]                 [place] [phone] (usually not accepting new patients unless you have family there already, you are always welcome to call and ask)            [facility] [phone] [place]) [phone]   [facility] Pediatricians/Family Doctors:   [place]: [phone]  [facility]: [phone]    Tercer trimestre de Media planner (Third Trimester of Pregnancy) El tercer trimestre comprende desde la semana29 hasta la [id number], es decir,  desde el mes7 hasta el mes9. El tercer trimestre es un perodo en el que el feto crece rpidamente. Hacia el final del noveno mes, el feto mide alrededor de 20pulgadas (45cm) de largo y pesa entre 6 y 18 libras (2,700 y 23,500kg). CAMBIOS EN EL ORGANISMO Su organismo atraviesa por muchos cambios durante el [place], y estos varan de Ardelia Mems mujer a Theatre manager.  Seguir [employer]. Es de esperar que aumente entre 25 y 35libras (62 y 16kg) hacia el final del Media planner.  Podrn aparecer las primeras Apache Corporation caderas, el abdomen y las Fox.  Puede tener necesidad de Garment/textile technologist con ms frecuencia porque el feto baja hacia la pelvis y ejerce presin sobre la vejiga.  Debido al Glennis Brink podr sentir Victorio Palm estomacal con frecuencia.  Puede estar estreida, ya que ciertas hormonas enlentecen los movimientos de los msculos que JPM[employer] Co desechos a travs de los intestinos.  Pueden aparecer hemorroides o abultarse e hincharse las venas (venas varicosas).  Puede sentir dolor plvico debido al Medtronic y a que las hormonas del Scientist, research (life sciences) las articulaciones entre los huesos de la pelvis. El dolor de espalda puede ser consecuencia de la sobrecarga de los msculos que soportan la [place].  Tal vez haya cambios en el cabello que pueden incluir su engrosamiento, crecimiento rpido y cambios en la textura. Adems,  a algunas mujeres se les cae el cabello durante o despus del [place], o tienen el cabello seco o fino. Lo ms probable es que el cabello se le normalice despus del nacimiento del beb.  Las Lincoln National Corporation seguirn creciendo y Teaching laboratory technician. A veces, puede haber una secrecin amarilla de las mamas llamada calostro.  El ombligo puede salir hacia afuera.  Puede sentir que le falta el aire debido a que se expande el tero.  Puede notar que el feto "baja" o lo siente ms bajo, en el abdomen.  Puede tener una prdida de secrecin mucosa con sangre. Esto suele ocurrir en el trmino de unos  pocos das a una semana antes de que comience el [place].  El cuello del tero se vuelve delgado y blando (se borra) cerca de la fecha de [place]. QU DEBE ESPERAR EN LOS EXMENES PRENATALES Le harn exmenes prenatales cada 2semanas hasta la semana36. A partir de ese momento le harn exmenes semanales. Durante una visita prenatal de rutina:  La pesarn para asegurarse de que usted y el feto estn creciendo normalmente.  Le tomarn la presin arterial.  Le medirn el abdomen para controlar el desarrollo del beb.  Se escucharn los latidos cardacos fetales.  Se evaluarn los resultados de los estudios solicitados en visitas anteriores.  Le revisarn el cuello del tero cuando est prxima la fecha de parto para controlar si este se ha borrado. Alrededor de la semana36, el mdico le revisar el cuello del tero. Al mismo tiempo, realizar un anlisis de las secreciones del tejido vaginal. Este examen es para determinar si hay un tipo de bacteria, estreptococo Grupo B. El mdico le explicar esto con ms detalle. El mdico puede preguntarle lo siguiente:  Cmo le gustara que fuera el [place].  Cmo se siente.  Si siente los movimientos del beb.  Si ha tenido sntomas anormales, como prdida de lquido, [place], dolores de cabeza intensos o clicos abdominales.  Si est consumiendo algn producto que contenga tabaco, como cigarrillos, tabaco de Higher education careers adviser y Psychologist, sport and exercise.  Si tiene Sunoco. Otros exmenes o estudios de deteccin que pueden realizarse durante el tercer trimestre incluyen lo siguiente:  Anlisis de sangre para controlar los niveles de hierro (anemia).  Controles fetales para determinar su salud, nivel de [place] y Mining engineer. Si tiene [place] enfermedad o hay problemas durante el embarazo, le harn estudios.  Prueba del VIH (virus de inmunodeficiencia humana). Si corre Electronics engineer, pueden realizarle una prueba de deteccin del VIH  durante el tercer trimestre del embarazo. FALSO TRABAJO DE PARTO Es posible que sienta contracciones leves e irregulares que finalmente desaparecen. Se llaman contracciones de Braxton Hicks o falso trabajo de [place]. Las Yahoo pueden durar horas, das o incluso semanas, antes de que el verdadero trabajo de parto se inicie. Si las contracciones ocurren a intervalos regulares, se intensifican o se hacen dolorosas, lo mejor es que la revise el mdico. SIGNOS DE TRABAJO DE PARTO  Clicos de tipo menstrual.  Contracciones cada 31minutos o menos.  Contracciones que comienzan en la parte superior del tero y se extienden hacia abajo, a la zona inferior del abdomen y la espalda.  Sensacin de mayor presin en la pelvis o dolor de espalda.  Una secrecin de mucosidad acuosa o con sangre que sale de la vagina. Si tiene alguno de estos signos antes de la [id number] del Media planner, llame a su mdico de inmediato. Debe concurrir al hospital para que la controlen inmediatamente. INSTRUCCIONES PARA EL CUIDADO EN EL  HOGAR  Evite fumar, consumir hierbas, beber alcohol y tomar frmacos que no le hayan recetado. Estas sustancias qumicas afectan la formacin y el desarrollo del beb.  No consuma ningn producto que contenga tabaco, lo que incluye cigarrillos, tabaco de Higher education careers adviser y Psychologist, sport and exercise. Si necesita ayuda para dejar de fumar, consulte al [employer]. Puede recibir asesoramiento y otro tipo de recursos para dejar de fumar.  Sabina mdico en relacin con el uso de medicamentos. Durante el embarazo, hay medicamentos que son seguros de tomar y otros que no.  Haga ejercicio solamente como se lo haya indicado el mdico. Sentir clicos uterinos es un buen signo para Ambulance person actividad fsica.  Contine comiendo alimentos sanos con regularidad.  Use un sostn que le brinde buen soporte si le Nordstrom.  No se d baos de inmersin en agua caliente, baos turcos ni  saunas.  Use el cinturn de seguridad en todo momento mientras conduce.  No coma carne cruda ni queso sin cocinar; evite el contacto con las bandejas sanitarias de los gatos y la tierra que estos animales usan. Estos elementos contienen grmenes que pueden causar defectos congnitos en el beb.  [place].  Tome entre 1500 y 2000mg  de calcio diariamente comenzando en la [id number] del embarazo [place].  Si est estreida, pruebe un laxante suave (si el mdico lo autoriza). Consuma ms alimentos ricos en fibra, como vegetales y frutas frescos y Psychologist, prison and probation services. Beba gran cantidad de lquido para mantener la orina de tono claro o color amarillo plido.  Dese baos de asiento con agua tibia para Best boy o las molestias causadas por las hemorroides. Use una crema para las hemorroides si el mdico la autoriza.  Si tiene venas varicosas, use medias de descanso. Eleve los pies durante 50minutos, 3 o 4veces por da. Limite el consumo de sal en su dieta.  Evite levantar objetos pesados, use zapatos de tacones bajos y [place].  Descanse con las piernas elevadas si tiene calambres o dolor de cintura.  Visite a su dentista si no lo ha Quarry manager. Use un cepillo de dientes blando para higienizarse los dientes y psese el hilo dental con suavidad.  Puede seguir American Electric Power, a menos que el mdico le indique lo contrario.  No haga viajes largos excepto que sea absolutamente necesario y solo con la autorizacin del [place] clases prenatales para Development worker, international aid, Psychologist, prison and probation services y hacer preguntas sobre el [place] de parto y [place].  Haga un ensayo de la partida al hospital.  Prepare el bolso que llevar al hospital.  Prepare la habitacin del beb.  Concurra a todas las visitas prenatales segn las indicaciones de su mdico. SOLICITE ATENCIN MDICA SI:  No est segura de que est en trabajo de parto o de  que ha roto la bolsa de las aguas.  Tiene mareos.  Siente clicos leves, presin en la pelvis o dolor persistente en el abdomen.  Tiene nuseas, vmitos o diarrea persistentes.  Margette Fast secrecin vaginal con mal olor.  Siente dolor al Continental Airlines. SOLICITE ATENCIN MDICA DE INMEDIATO SI:  Tiene fiebre.  Tiene una prdida de lquido por la vagina.  Tiene sangrado o pequeas prdidas vaginales.  Siente dolor intenso o clicos en el abdomen.  Sube o baja de peso rpidamente.  Tiene dificultad para respirar y siente dolor de pecho.  Sbitamente se le hinchan mucho el rostro, las [place], los tobillos, los pies o las piernas.  No ha sentido los movimientos del beb durante Leone Brand.  Siente un dolor de cabeza intenso que no se alivia con medicamentos.  Su visin se modifica. Esta informacin no tiene Marine scientist el consejo del mdico. Asegrese de hacerle al mdico cualquier pregunta que tenga. Document Released: 05/07/2005 Document Revised: 08/18/2014 Document Reviewed: 09/28/2012 Elsevier Interactive Patient Education  2017 Reynolds American.

## 2016-10-14 LAB — ANTIBODY SCREEN: Antibody Screen: NEGATIVE

## 2016-10-14 LAB — CBC
HEMATOCRIT: 38 % (ref 34.0–46.6)
HEMOGLOBIN: 12.6 g/dL (ref 11.1–15.9)
MCH: 29 pg (ref 26.6–33.0)
MCHC: 33.2 g/dL (ref 31.5–35.7)
MCV: 88 fL (ref 79–97)
Platelets: 243 10*3/uL (ref 150–379)
RBC: 4.34 x10E6/uL (ref 3.77–5.28)
RDW: 15.2 % (ref 12.3–15.4)
WBC: 9.5 10*3/uL (ref 3.4–10.8)

## 2016-10-14 LAB — RPR: RPR: NONREACTIVE

## 2016-10-14 LAB — GLUCOSE TOLERANCE, 2 HOURS W/ 1HR
Glucose, 1 hour: 94 mg/dL (ref 65–179)
Glucose, 2 hour: 96 mg/dL (ref 65–152)
Glucose, Fasting: 75 mg/dL (ref 65–91)

## 2016-10-14 LAB — HIV ANTIBODY (ROUTINE TESTING W REFLEX): HIV Screen 4th Generation wRfx: NONREACTIVE

## 2016-11-10 ENCOUNTER — Encounter (INDEPENDENT_AMBULATORY_CARE_PROVIDER_SITE_OTHER): Payer: Self-pay

## 2016-11-10 ENCOUNTER — Encounter: Payer: Self-pay | Admitting: Obstetrics & Gynecology

## 2016-11-10 ENCOUNTER — Ambulatory Visit (INDEPENDENT_AMBULATORY_CARE_PROVIDER_SITE_OTHER): Payer: Self-pay | Admitting: Obstetrics & Gynecology

## 2016-11-10 VITALS — BP 104/62 | HR 96 | Wt 142.0 lb

## 2016-11-10 DIAGNOSIS — Z1389 Encounter for screening for other disorder: Secondary | ICD-10-CM

## 2016-11-10 DIAGNOSIS — Z3483 Encounter for supervision of other normal pregnancy, third trimester: Secondary | ICD-10-CM

## 2016-11-10 DIAGNOSIS — Z331 Pregnant state, incidental: Secondary | ICD-10-CM

## 2016-11-10 LAB — POCT URINALYSIS DIPSTICK
Blood, UA: NEGATIVE
GLUCOSE UA: NEGATIVE
KETONES UA: NEGATIVE
Nitrite, UA: NEGATIVE
Protein, UA: NEGATIVE

## 2016-11-10 NOTE — Progress Notes (Signed)
[id number] [redacted]w[redacted]d Estimated Date of Delivery: 01/04/17  Blood pressure 104/62, pulse 96, weight 142 lb (64.4 kg), last menstrual period 04/20/2016, currently breastfeeding.   BP weight and urine results all reviewed and noted.  Please refer to the obstetrical flow sheet for the fundal height and fetal heart rate documentation:  Patient reports good fetal movement, denies any bleeding and no rupture of membranes symptoms or regular contractions. Patient is without complaints. All questions were answered.  Orders Placed This Encounter  Procedures  . POCT urinalysis dipstick    Plan:  Continued routine obstetrical care,   Return in about 2 weeks (around 11/24/2016) for LROB.

## 2016-11-24 ENCOUNTER — Ambulatory Visit (INDEPENDENT_AMBULATORY_CARE_PROVIDER_SITE_OTHER): Payer: Self-pay | Admitting: Obstetrics and Gynecology

## 2016-11-24 ENCOUNTER — Encounter: Payer: Self-pay | Admitting: Obstetrics and Gynecology

## 2016-11-24 VITALS — BP 104/56 | HR 76 | Wt 144.0 lb

## 2016-11-24 DIAGNOSIS — Z1389 Encounter for screening for other disorder: Secondary | ICD-10-CM

## 2016-11-24 DIAGNOSIS — Z3483 Encounter for supervision of other normal pregnancy, third trimester: Secondary | ICD-10-CM

## 2016-11-24 DIAGNOSIS — Z331 Pregnant state, incidental: Secondary | ICD-10-CM

## 2016-11-24 DIAGNOSIS — Z3682 Encounter for antenatal screening for nuchal translucency: Secondary | ICD-10-CM

## 2016-11-24 LAB — POCT URINALYSIS DIPSTICK
Glucose, UA: NEGATIVE
Ketones, UA: NEGATIVE
Nitrite, UA: NEGATIVE
PROTEIN UA: NEGATIVE
RBC UA: NEGATIVE

## 2016-11-24 NOTE — Progress Notes (Signed)
Patient ID: Morgan Campos, female   DOB: 1986-06-17, 31 y.o.   MRN: [id number]  [id number]  Estimated Date of Delivery: 01/04/17 LROB [redacted]w[redacted]d  Translator used   Chief Complaint  Patient presents with  . Routine Prenatal Visit    feet starting to swell, incrased abd pressure and occ cramping  ____  Patient complaints: She complains of mild bilateral feet swelling for 3 days.  Patient reports good fetal movement. She denies any bleeding, rupture of membranes,or regular contractions.  Blood pressure (!) 104/56, pulse 76, weight 144 lb (65.3 kg), last menstrual period 04/20/2016, currently breastfeeding.   Urine results:notable for trace leukocytes  refer to the ob flow sheet for FH and FHR, ,                          Physical Examination: General appearance - alert, well appearing, and in no distress                                      Abdomen - FH 35 cm                                                        -FHR 145 bpm                                                         soft, nontender, nondistended, no masses or organomegaly                                            Questions were answered. Assessment: LROB G3P2002 @ [redacted]w[redacted]d   Plan:  Continued routine obstetrical care  F/u in 2 weeks for routine prenatal care   By signing my name below, I, Hansel Feinstein, attest that this documentation has been prepared under the direction and in the presence of Jonnie Kind, MD. Electronically Signed: Hansel Feinstein, ED Scribe. 11/24/16. 9:52 AM.  I personally performed the services described in this documentation, which was SCRIBED in my presence. The recorded information has been reviewed and considered accurate. It has been edited as necessary during review. Jonnie Kind, MD

## 2016-12-08 ENCOUNTER — Encounter: Payer: Self-pay | Admitting: Women's Health

## 2016-12-23 ENCOUNTER — Encounter (HOSPITAL_COMMUNITY): Payer: Self-pay | Admitting: *Deleted

## 2016-12-23 ENCOUNTER — Inpatient Hospital Stay (HOSPITAL_COMMUNITY)
Admission: AD | Admit: 2016-12-23 | Discharge: 2016-12-25 | DRG: 775 | Disposition: A | Discharge status: Home / Self Care | Payer: Medicaid Other | Source: Ambulatory Visit | Attending: Obstetrics and Gynecology | Admitting: Obstetrics and Gynecology

## 2016-12-23 DIAGNOSIS — Z283 Underimmunization status: Secondary | ICD-10-CM

## 2016-12-23 DIAGNOSIS — O0930 Supervision of pregnancy with insufficient antenatal care, unspecified trimester: Secondary | ICD-10-CM

## 2016-12-23 DIAGNOSIS — Z2839 Other underimmunization status: Secondary | ICD-10-CM

## 2016-12-23 DIAGNOSIS — N63 Unspecified lump in unspecified breast: Secondary | ICD-10-CM

## 2016-12-23 DIAGNOSIS — O09899 Supervision of other high risk pregnancies, unspecified trimester: Secondary | ICD-10-CM

## 2016-12-23 DIAGNOSIS — Z3A38 38 weeks gestation of pregnancy: Secondary | ICD-10-CM

## 2016-12-23 DIAGNOSIS — Z3493 Encounter for supervision of normal pregnancy, unspecified, third trimester: Secondary | ICD-10-CM | POA: Diagnosis present

## 2016-12-23 LAB — URINALYSIS, ROUTINE W REFLEX MICROSCOPIC
Bacteria, UA: NONE SEEN
Bilirubin Urine: NEGATIVE
Glucose, UA: NEGATIVE mg/dL
Ketones, ur: NEGATIVE mg/dL
Nitrite: NEGATIVE
Protein, ur: NEGATIVE mg/dL
Specific Gravity, Urine: 1.02 (ref 1.005–1.030)
pH: 5 (ref 5.0–8.0)

## 2016-12-23 LAB — TYPE AND SCREEN
ABO/RH(D): O POS
Antibody Screen: NEGATIVE

## 2016-12-23 LAB — CBC
HCT: 37.9 % (ref 36.0–46.0)
HEMOGLOBIN: 12.5 g/dL (ref 12.0–15.0)
MCH: 29.4 pg (ref 26.0–34.0)
MCHC: 33 g/dL (ref 30.0–36.0)
MCV: 89.2 fL (ref 78.0–100.0)
Platelets: 139 10*3/uL — ABNORMAL LOW (ref 150–400)
RBC: 4.25 MIL/uL (ref 3.87–5.11)
RDW: 14.2 % (ref 11.5–15.5)
WBC: 11.1 10*3/uL — ABNORMAL HIGH (ref 4.0–10.5)

## 2016-12-23 MED ORDER — ACETAMINOPHEN 325 MG PO TABS: 650.0000 mg | ORAL_TABLET | ORAL | Status: DC | PRN

## 2016-12-23 MED ORDER — ZOLPIDEM TARTRATE 5 MG PO TABS
5.0000 mg | ORAL_TABLET | Freq: Every evening | ORAL | Status: DC | PRN
Start: 2016-12-23 — End: 2016-12-25

## 2016-12-23 MED ORDER — OXYTOCIN 40 UNITS IN LACTATED RINGERS INFUSION - SIMPLE MED: 2.5000 [IU]/h | INTRAVENOUS | Status: DC

## 2016-12-23 MED ORDER — SIMETHICONE 80 MG PO CHEW: 80.0000 mg | CHEWABLE_TABLET | ORAL | Status: DC | PRN

## 2016-12-23 MED ORDER — OXYTOCIN BOLUS FROM INFUSION: 500.0000 mL | Freq: Once | INTRAVENOUS | Status: DC

## 2016-12-23 MED ORDER — OXYCODONE-ACETAMINOPHEN 5-325 MG PO TABS: 2.0000 | ORAL_TABLET | ORAL | Status: DC | PRN

## 2016-12-23 MED ORDER — OXYTOCIN 10 UNIT/ML IJ SOLN
INTRAMUSCULAR | Status: AC
  Administered 2016-12-23: 10 [IU] via INTRAMUSCULAR
  Filled 2016-12-23: qty 1

## 2016-12-23 MED ORDER — LACTATED RINGERS IV SOLN: INTRAVENOUS | Status: DC

## 2016-12-23 MED ORDER — ONDANSETRON HCL 4 MG/2ML IJ SOLN: 4.0000 mg | Freq: Four times a day (QID) | INTRAMUSCULAR | Status: DC | PRN

## 2016-12-23 MED ORDER — SOD CITRATE-CITRIC ACID 500-334 MG/5ML PO SOLN: 30.0000 mL | ORAL | Status: DC | PRN

## 2016-12-23 MED ORDER — ONDANSETRON HCL 4 MG/2ML IJ SOLN: 4.0000 mg | INTRAMUSCULAR | Status: DC | PRN

## 2016-12-23 MED ORDER — LIDOCAINE HCL (PF) 1 % IJ SOLN
30.0000 mL | INTRAMUSCULAR | Status: DC | PRN
  Filled 2016-12-23: qty 30

## 2016-12-23 MED ORDER — DIPHENHYDRAMINE HCL 25 MG PO CAPS
25.0000 mg | ORAL_CAPSULE | Freq: Four times a day (QID) | ORAL | Status: DC | PRN
Start: 2016-12-23 — End: 2016-12-25

## 2016-12-23 MED ORDER — OXYCODONE-ACETAMINOPHEN 5-325 MG PO TABS: 1.0000 | ORAL_TABLET | ORAL | Status: DC | PRN

## 2016-12-23 MED ORDER — ONDANSETRON HCL 4 MG PO TABS: 4.0000 mg | ORAL_TABLET | ORAL | Status: DC | PRN

## 2016-12-23 MED ORDER — LACTATED RINGERS IV SOLN: 500.0000 mL | INTRAVENOUS | Status: DC | PRN

## 2016-12-23 MED ORDER — BENZOCAINE-MENTHOL 20-0.5 % EX AERO: 1.0000 "application " | INHALATION_SPRAY | CUTANEOUS | Status: DC | PRN

## 2016-12-23 MED ORDER — ACETAMINOPHEN 325 MG PO TABS
650.0000 mg | ORAL_TABLET | ORAL | Status: DC | PRN
  Administered 2016-12-23 – 2016-12-25 (×3): 650 mg via ORAL
  Filled 2016-12-23 (×5): qty 2

## 2016-12-23 MED ORDER — WITCH HAZEL-GLYCERIN EX PADS: 1.0000 "application " | MEDICATED_PAD | CUTANEOUS | Status: DC | PRN

## 2016-12-23 MED ORDER — DIBUCAINE 1 % RE OINT: 1.0000 "application " | TOPICAL_OINTMENT | RECTAL | Status: DC | PRN

## 2016-12-23 MED ORDER — SENNOSIDES-DOCUSATE SODIUM 8.6-50 MG PO TABS
2.0000 | ORAL_TABLET | ORAL | Status: DC
  Administered 2016-12-23 – 2016-12-24 (×2): 2 via ORAL
  Filled 2016-12-23 (×2): qty 2

## 2016-12-23 MED ORDER — TETANUS-DIPHTH-ACELL PERTUSSIS 5-2.5-18.5 LF-MCG/0.5 IM SUSP
0.5000 mL | Freq: Once | INTRAMUSCULAR | Status: AC
  Administered 2016-12-25: 0.5 mL via INTRAMUSCULAR
  Filled 2016-12-23: qty 0.5

## 2016-12-23 MED ORDER — PRENATAL MULTIVITAMIN CH
1.0000 | ORAL_TABLET | Freq: Every day | ORAL | Status: DC
  Administered 2016-12-24 – 2016-12-25 (×2): 1 via ORAL
  Filled 2016-12-23 (×2): qty 1

## 2016-12-23 MED ORDER — IBUPROFEN 600 MG PO TABS
600.0000 mg | ORAL_TABLET | Freq: Four times a day (QID) | ORAL | Status: DC
  Administered 2016-12-23 – 2016-12-25 (×9): 600 mg via ORAL
  Filled 2016-12-23 (×9): qty 1

## 2016-12-23 MED ORDER — COCONUT OIL OIL: 1.0000 "application " | TOPICAL_OIL | Status: DC | PRN

## 2016-12-23 MED ORDER — FLEET ENEMA 7-19 GM/118ML RE ENEM: 1.0000 | ENEMA | RECTAL | Status: DC | PRN

## 2016-12-23 MED ORDER — LIDOCAINE HCL (PF) 1 % IJ SOLN
INTRAMUSCULAR | Status: AC
  Filled 2016-12-23: qty 30

## 2016-12-23 NOTE — Lactation Note (Signed)
This note was copied from a baby's chart. Lactation Consultation Note P3 mom with 3 months bf exp. With 4 yr. Old and now 8 yr. Old.  This is mom's 3rd Morgan.  Interpreter/dexter used, (already in room when [facility] entered room).  Mom states she had low milk supply with first two but supplemented early on.  Mom had 32 year old at [facility] hospital.  Mom states she has masses in breast but they "are okay".  She tell LC that she has been to MD and had the masses addressed.  Mom denies tenderness on left breast, mid/center where lump is palpated.  [place] taught mom hand expression and mom had some tenderness; mom did demonstrate hand expression on her own and was able to express gtts of colostrum.  Mom seemed pleased.  LC also showed mom illustrations in book of hand expression.  Mom attempted to latch infant in football hold on right but was not comfortable.  LC assisted in repositioning to cross cradle hold but mom was most comfortable in cradle hold.  Infant latched well and would get into a pattern with stimulation.  Cueing and waking techniques were discussed with mom and dad as well as importance of STS and feeding infant 8-12 times in a 24 hour period.  Mom given Spanish brochure along with resource sheet.  LC encouraged mom to call out with assistance latching and or concerns with BF.  Mom and dad receptive to information and LC help.      Patient Name: Morgan Campos Today's Date: 12/23/2016 Reason for consult: Initial assessment   Maternal Data Formula Feeding for Exclusion: No Has patient been taught Hand Expression?: Yes Does the patient have breastfeeding experience prior to this delivery?: Yes (3 months with 31 yr old and 17 yr. old)  Feeding Feeding Type: Breast Fed Length of feed: 10 min  LATCH Score/Interventions Latch: Grasps breast easily, tongue down, lips flanged, rhythmical sucking.  Audible Swallowing: A few with stimulation  Type of Nipple: Everted at rest and after  stimulation  Comfort (Breast/Nipple): Soft / non-tender     Hold (Positioning): No assistance needed to correctly position infant at breast. Intervention(s): Breastfeeding basics reviewed;Support Pillows  LATCH Score: 9  Lactation Tools Discussed/Used     Consult Status Consult Status: Follow-up Date: 12/24/16 Follow-up type: In-patient    Ferne Coe [facility] 12/23/2016, 4:45 PM

## 2016-12-23 NOTE — H&P (Signed)
LABOR AND DELIVERY ADMISSION HISTORY AND PHYSICAL NOTE  Morgan Campos is a 31 y.o. female G3P2002 with IUP at [redacted]w[redacted]d by U/S @24  week presenting for SOL s/p SROM. Patient arrive in the MAU in active labor after SROM prior admission. Upon admission, patient was found to be 8 cm dilated with small cervical lip with regular contractions. Patient was promptly moved to birthing suite where she was found complete and started to push. Prenatal course was uncomplicated.   She reports positive fetal movement. She denies leakage of fluid or vaginal bleeding.  Prenatal History/Complications:  Past Medical History: Past Medical History:  Diagnosis Date  . Medical history non-contributory   . No pertinent past medical history     Past Surgical History: Past Surgical History:  Procedure Laterality Date  . APPENDECTOMY      Obstetrical History: OB History    Gravida Para Term Preterm AB Living   3 2 2     2    SAB TAB Ectopic Multiple Live Births           2      Social History: Social History   Social History  . Marital status: Married    Spouse name: N/A  . Number of children: N/A  . Years of education: N/A   Social History Main Topics  . Smoking status: Never Smoker  . Smokeless tobacco: Never Used  . Alcohol use No  . Drug use: No  . Sexual activity: Yes    Birth control/ protection: None   Other Topics Concern  . None   Social History Narrative  . None    Family History: History reviewed. No pertinent family history.  Allergies: No Known Allergies  Prescriptions Prior to Admission  Medication Sig Dispense Refill Last Dose  . Prenatal Vit-Fe Fumarate-FA (PRENATAL MULTIVITAMIN) TABS Take 1 tablet by mouth daily.   Taking     Review of Systems   All systems reviewed and negative except as stated in HPI  Height 5\' 3"  (1.6 m), weight 149 lb (67.6 kg), last menstrual period 04/20/2016, currently breastfeeding. General appearance: alert, cooperative,  appears stated age and moderate distress Lungs: Normal respiratory effort, no audible wheezing Heart: regular rate and pulses palpated bilaterally upper and lower extremities Abdomen: soft, non-tender, gravid abdomen appropriate for gestational age Extremities: No calf swelling or tenderness Presentation: cephalic by nurse exam Fetal monitoring: N/A Uterine activity: N/A     Prenatal labs: ABO, Rh: O/Positive/-- (02/12 1200) Antibody: Negative (03/05 0928) Rubella: Immune  RPR: Non Reactive (03/05 0928)  HBsAg: Negative (02/12 1200)  HIV: Non Reactive (03/05 0928)  GBS:  Unknown 1 hr Glucola: 94 Genetic screening: Late PNC Anatomy US: Normal   Prenatal Transfer Tool  Maternal Diabetes: No Genetic Screening: Abnormal:  Results: Other: PNC late Maternal Ultrasounds/Referrals: Normal Fetal Ultrasounds or other Referrals:  None Maternal Substance Abuse:  No Significant Maternal Medications:  None Significant Maternal Lab Results: None  Results for orders placed or performed during the hospital encounter of 12/23/16 (from the past 24 hour(s))  Urinalysis, Routine w reflex microscopic   Collection Time: 12/23/16 12:00 PM  Result Value Ref Range   Color, Urine YELLOW YELLOW   APPearance HAZY (A) CLEAR   Specific Gravity, Urine 1.020 1.005 - 1.030   pH 5.0 5.0 - 8.0   Glucose, UA NEGATIVE NEGATIVE mg/dL   Hgb urine dipstick LARGE (A) NEGATIVE   Bilirubin Urine NEGATIVE NEGATIVE   Ketones, ur NEGATIVE NEGATIVE mg/dL   Protein, ur  NEGATIVE NEGATIVE mg/dL   Nitrite NEGATIVE NEGATIVE   Leukocytes, UA TRACE (A) NEGATIVE   RBC / HPF 6-30 0 - 5 RBC/hpf   WBC, UA 6-30 0 - 5 WBC/hpf   Bacteria, UA NONE SEEN NONE SEEN   Squamous Epithelial / LPF 6-30 (A) NONE SEEN   Mucous PRESENT     Patient Active Problem List   Diagnosis Date Noted  . Normal labor 12/23/2016  . Normal labor and delivery 12/23/2016  . Breast masses 10/13/2016  . Susceptible to varicella (non-immune),  currently pregnant 09/25/2016  . Encounter for supervision of other normal pregnancy 09/22/2016  . Late prenatal care 09/22/2016    Assessment: Morgan Campos is a 31 y.o. G3P2002 at [redacted]w[redacted]d here for SOL s/p SROM.  #Labor: admitted in active labor #Pain: No IV pain medications #FWB: N/A #ID:  GBS unknown  #MOF: Breast/ Bottle #MOC: Depo  #Circ:  Declined  Morgan Campos, PGY-1 12/23/2016, 12:50 PM

## 2016-12-23 NOTE — MAU Note (Signed)
@   1155 -Patient brought back to MAU room 1 with her husband for labor evaluation; translator with nurse- pt is Spanish speaking; @ 1210- SVE was 8/ and MD notified; @1213 - SROM/ clear fluid  and pt feeling the urge to push; @ 1222- MAU provider in the room and Dr Elly Modena en-route to room; @1226 - Dr Elly Modena in and 2nd SVE done; decision made to transport pt to [facility]; @ 1235-transported to L/D to room 174;

## 2016-12-24 LAB — RPR: RPR Ser Ql: NONREACTIVE

## 2016-12-24 NOTE — Plan of Care (Signed)
Problem: Pain Managment: Goal: General experience of comfort will improve Outcome: Progressing At 0300 check, pt stated she needed "one" pill for pain but did not provide a number. I stepped out of the room to give pt tylenol, but when I returned she was asleep again. I replaced the tylenol in the [place].

## 2016-12-24 NOTE — Lactation Note (Signed)
This note was copied from a baby's chart. Lactation Consultation Note  Patient Name: Morgan Campos Today's Date: 12/24/2016   Per interpreter, mother has declined a visit from Lactation.  Matthias Hughs [facility] 12/24/2016, 4:51 PM

## 2016-12-24 NOTE — Lactation Note (Signed)
This note was copied from a baby's chart. Lactation Consultation Note Mom BF swaddled, long sleeve outfit, hat and rm. 80 degrees. rm temperature dropped to 73 degrees. Unwrapped baby, hat removed. Encouraged not to BF baby with so much clothing and cover w/rm. Hot.  [name] interpreter used #[id number] [name]..  Mom states baby hurts biting, flanged baby's mouth open wider. Baby bites, tongue thrust, once baby got good sucking ryhtm, chin tug demonstrated. Mom stated she wants formula. Since mom has hx; of low milk supply DEBP set up. Mom isn't to interested in pumping. Mom's Lt. Breast is very lumpy, tender. Mom states she has knots and a mass. Hand expressed colostrum.  Supplement information sheet discussed. Baby chewed on nipple. Suck training w/gloved finger. Humps tongue up in back. Stroke tongue, good lateralization. Took a good while to get baby to suckle on nipple. Took 11 ml formula.  FOB at bedside. Has app on phone that translates [place] to [place]. Used [name].  Patient Name: Morgan Campos NATFT'D Date: 12/24/2016 Reason for consult: Follow-up assessment   Maternal Data    Feeding Feeding Type: Formula Nipple Type: Slow - flow Length of feed: 15 min  LATCH Score/Interventions Latch: Repeated attempts needed to sustain latch, nipple held in mouth throughout feeding, stimulation needed to elicit sucking reflex. Intervention(s): Adjust position;Assist with latch;Breast massage;Breast compression  Audible Swallowing: Spontaneous and intermittent Intervention(s): Hand expression  Type of Nipple: Everted at rest and after stimulation  Comfort (Breast/Nipple): Filling, red/small blisters or bruises, mild/mod discomfort  Problem noted: Mild/Moderate discomfort Interventions (Mild/moderate discomfort): Post-pump;Hand massage;Hand expression  Hold (Positioning): Assistance needed to correctly position infant at breast and maintain latch. Intervention(s): Breastfeeding  basics reviewed;Support Pillows;Position options;Skin to skin  LATCH Score: 7  Lactation Tools Discussed/Used Tools: Pump Breast pump type: Double-Electric Breast Pump Pump Review: Setup, frequency, and cleaning;Milk Storage Initiated by:: Allayne Stack RN IBCLC Date initiated:: 12/24/16   Consult Status Consult Status: Follow-up Date: 12/24/16 (in pm) Follow-up type: In-patient    [name], [name] 12/24/2016, 2:23 AM

## 2016-12-24 NOTE — Progress Notes (Signed)
Post Partum Day *#1 Subjective: no complaints, up ad lib, voiding and tolerating PO  Objective: Blood pressure 100/66, pulse 75, temperature 97.6 F (36.4 C), temperature source Axillary, resp. rate 18, height 5\' 3"  (1.6 m), weight 67.6 kg (149 lb), last menstrual period 04/20/2016, unknown if currently breastfeeding.  Physical Exam:  General: alert Lochia: appropriate Uterine Fundus: firm and NT at U-1 DVT Evaluation: No evidence of DVT seen on physical exam.   Recent Labs  12/23/16 1248  HGB 12.5  HCT 37.9    Assessment/Plan: Plan for discharge tomorrow (unknown GBS status)   LOS: 1 day   Kaisa Wofford C Zakari Couchman 12/24/2016, 6:06 AM

## 2016-12-25 MED ORDER — IBUPROFEN 600 MG PO TABS: 600.0000 mg | ORAL_TABLET | Freq: Four times a day (QID) | ORAL | 0 refills | Status: DC | PRN

## 2016-12-25 NOTE — Progress Notes (Signed)
Discharge instructions given, with interpreter at bedside. Questions answered, pt states understanding, signs and given copy

## 2016-12-25 NOTE — Discharge Summary (Signed)
OB Discharge Summary     Patient Name: Morgan Campos DOB: [birth year] MRN: [id number]  Date of admission: 12/23/2016 Delivering MD: Mora Bellman   Date of discharge: 12/25/2016  Admitting diagnosis: SROM Intrauterine pregnancy: [redacted]w[redacted]d     Secondary diagnosis:  Active Problems:   Normal labor   Normal labor and delivery  Additional problems: late to care @ 25wks     Discharge diagnosis: Term Pregnancy Delivered                                                                                                Post partum procedures:none  Augmentation: none  Complications: None  Hospital course:  Onset of Labor With Vaginal Delivery     31 y.o. yo [id number] at [redacted]w[redacted]d was admitted in Active Labor on 12/23/2016. Patient had an uncomplicated labor course as follows:  Membrane Rupture Time/Date: 12:25 PM ,12/23/2016   Intrapartum Procedures: Episiotomy: None [1]                                         Lacerations:  None [1]  Patient had a delivery of a Viable infant. 12/23/2016  Information for the patient's newborn:  Morgan, Campos [[id number]]  Delivery Method: Vag-Spont    Pateint had an uncomplicated postpartum course.  She is ambulating, tolerating a regular diet, passing flatus, and urinating well. Patient is discharged home in stable condition on 12/25/16.   Physical exam  Vitals:   12/24/16 0320 12/24/16 0609 12/24/16 1700 12/25/16 0513  BP: 100/66 111/72 113/65 105/65  Pulse: 75 74 74 65  Resp: 18 18 18 16   Temp: 97.6 F (36.4 C) 97.7 F (36.5 C) 98.5 F (36.9 C) 98.4 F (36.9 C)  TempSrc: Axillary Oral  Oral  Weight:      Height:       General: alert and cooperative Lochia: appropriate Uterine Fundus: firm Incision: N/A DVT Evaluation: No evidence of DVT seen on physical exam. Labs: Lab Results  Component Value Date   WBC 11.1 (H) 12/23/2016   HGB 12.5 12/23/2016   HCT 37.9 12/23/2016   MCV 89.2 12/23/2016   PLT 139 (L) 12/23/2016    No flowsheet data found.  Discharge instruction: per After Visit Summary and "Baby and Me Booklet".  After visit meds:  Allergies as of 12/25/2016   No Known Allergies     Medication List    TAKE these medications   ibuprofen 600 MG tablet Commonly known as:  ADVIL,MOTRIN Take 1 tablet (600 mg total) by mouth every 6 (six) hours as needed.   prenatal multivitamin Tabs tablet Take 1 tablet by mouth daily.       Diet: routine diet  Activity: Advance as tolerated. Pelvic rest for 6 weeks.   Outpatient follow up:6 weeks Follow up Appt:No future appointments. Follow up Visit:No Follow-up on file.  Postpartum contraception: Depo Provera  Newborn Data: Live born female  Birth Weight: 8 lb 0.4 oz (3640 g) APGAR: 9, 9  Baby  Feeding: Bottle and Breast Disposition:home with mother   12/25/2016 [name], CNM  8:31 AM  Attestation of Attending Supervision of Certified Nurse Midwife: Evaluation and management procedures were performed by the CNM under my supervision.  I have reviewed the CNM's note and chart, and I agree with the management and plan.   Durene Romans MD Attending Center for [employer] farm manager)

## 2016-12-25 NOTE — Lactation Note (Signed)
This note was copied from a baby's chart. Lactation Consultation Note  Patient Name: Morgan Campos Date: 12/25/2016 Reason for consult: Follow-up assessment  Follow up visit at 71 hours of age, with spanish interpreter, Morgan Campos. Mom reports working on breast and bottle feedings. Mom reports recent breast feeding of 30 minutes and then supplemented with 37mls of formula. LC discussed supply and demand and importance of expressing or latching baby 8-12x/day.  Mom plans to call [facility] to schedule appointment. Discussed milk transitioning to larger volume, engorgement care discussed.  Encouraged frequent feedings. Mom to soften breast as needed prior to latch.      Maternal Data Has patient been taught Hand Expression?: Yes Does the patient have breastfeeding experience prior to this delivery?: Yes  Feeding Feeding Type: (P) Bottle Fed - Formula Length of feed: 30 min  LATCH Score/Interventions                Intervention(s): Breastfeeding basics reviewed     Lactation Tools Discussed/Used WIC Program: Yes (plans to call for appointment)   Consult Status Consult Status: Complete    Shoptaw, Justine Null 12/25/2016, 11:49 AM

## 2016-12-25 NOTE — Discharge Instructions (Signed)
Instrucciones para la mamá sobre los cuidados en el hogar °(Home Care Instructions for Mom) °ACTIVIDAD °· Reanude sus actividades regulares de forma gradual. °· Descanse. Tome siestas cuando el bebé duerme. °· No levante objetos que pesen más de 10 libras (4,5 kg) hasta que el médico se lo autorice. °· Evite las actividades que demandan mucho esfuerzo y energía (que son extenuantes) hasta que el médico se lo autorice. Caminar a un ritmo tranquilo a moderado siempre es más seguro. °· Si tuvo un parto por cesárea: °? No pase la aspiradora, suba escaleras o conduzca un vehículo durante 4 o 6 semanas. °? Pídale a alguien que le brinde ayuda con las tareas domésticas hasta que pueda realizarlas por su cuenta. °? Haga ejercicios como se lo haya indicado el médico, si corresponde. ° °HEMORRAGIA VAGINAL °Probablemente continúe sangrando durante 4 o 6 semanas después del parto. Generalmente, la cantidad de sangre disminuye y el color se hace más claro con el transcurso del tiempo. Sin embargo, si usted está demasiado activa, el color de la sangre puede ser rojo brillante. Si necesita cambiarse la compresa higiénica en menos de una hora o tiene coágulos grandes: °· Permanezca acostada. °· Eleve los pies. °· Coloque compresas frías en la zona inferior del abdomen. °· Haga reposo. °· Comuníquese con su médico. °Si está amamantando, podría volver a tener su período entre las 8 semanas después del parto y el momento en que deje de amamantar. Si no está amamantando, volverá a tener su período 6 u 8 semanas después del parto. °CUIDADOS PERINEALES °La zona perineal o perineo, es la parte del cuerpo que se encuentra entre los muslos. Después del parto, esta zona necesita un cuidado especial. Siga las siguientes indicaciones como se lo haya indicado su médico. °· Tome baños de inmersión durante 15 o 20 minutos. °· Utilice apósitos o aerosoles analgésicos y cremas como se lo hayan indicado. °· No utilice tampones ni se haga duchas  vaginales hasta que el sangrado vaginal se haya detenido. °· Cada vez que vaya al baño: °? Use una botella perineal. °? Cámbiese el apósito. °? Use papel tisú en lugar de papel higiénico hasta que se cure la sutura. °· Haga ejercicios de Kegel todos los días. Los ejercicios Kegel ayudan a mantener los músculos que sostienen la vagina, la vejiga y los intestinos. Estos ejercicios se pueden realizar mientras está parada, sentada o acostada. Para hacer los ejercicios de Kegel: °? Tense los músculos del estómago y los que rodean el canal de parto. °? Mantenga esta posición durante unos segundos. °? Relájese. °? Repita hasta hacerlos 5 veces seguidas. °· Para evitar las hemorroides o que estas empeoren: °? Beba suficiente líquido para mantener la orina clara o de color amarillo pálido. °? Evite hacer fuerza al defecar. °? Tome los medicamentos y laxantes de venta libre como se lo haya indicado el médico. °CUIDADO DE LAS MAMAS °· Use un buen sostén. °· Evite tomar analgésicos de venta libre para las molestias de los pechos. °· Aplique hielo en los pechos para aliviar las molestias tanto como sea necesario: °? Ponga el hielo en una bolsa plástica. °? Coloque una toalla entre la piel y la bolsa de hielo. °? Aplique el hielo durante 20, o como se lo haya indicado el médico. ° °NUTRICIÓN °· Mantenga una dieta bien balanceada. °· No intente perder de peso rápidamente reduciendo el consumo de calorías. °· Tome sus vitaminas prenatales hasta el control de postparto o hasta que su médico se lo indique. ° °DEPRESIÓN POSTPARTO °  Puede sentir deseos de llorar sin motivo aparente y verse incapaz de enfrentarse a todos los cambios que implica tener un bebé. Este estado de ánimo se llama depresión postparto. La depresión postparto ocurre porque sus niveles hormonales sufren cambios después del parto. Si usted tiene depresión postparto, busque contención por parte de su pareja, sus amigos y su familia. Si la depresión no desaparece por  sí sola después de algunas semanas, concurra a su médico. °AUTOEXAMEN DE MAMAS °Realícese autoexámenes en el mismo momento cada mes. Si está amamantando, el mejor momento de controlar sus mamas es después de alimentar al bebé, cuando los pechos no están tan llenos. Si está amamantando y su período ya comenzó, controle sus mamas el día 5, 6 o 7 de su período. °Informe a su médico de cualquier protuberancia, bulto o secreción. Si está amamantando, las mamas normalmente tienen bultos. Esto es transitorio y no es un riesgo para la salud. °INTIMIDAD Y SEXUALIDAD °Debe evitar las relaciones sexuales durante al menos 3 o 4 semanas después del parto o hasta que el flujo de color rojo amarronado haya desaparecido completamente. Si no desea quedar embarazada nuevamente, use algún método anticonceptivo. Después del parto, puede quedar embarazada incluso si no ha tenido todavía el período. °SOLICITE ATENCIÓN MÉDICA SI: °· Se siente incapaz de controlar los cambios que implica tener un hijo y esos sentimientos no desaparecen después de algunas semanas. °· Detecta una protuberancia, bulto o secreción en sus mamas. ° °SOLICITE ATENCIÓN MÉDICA DE INMEDIATO SI: °· Debe cambiarse la compresa higiénica en 1 hora o menos. °· Tiene los siguientes síntomas: °? Dolor intenso o calambres en la parte inferior del abdomen. °? Una secreción vaginal con mal olor. °? Fiebre que no se alivia con los medicamentos. °? Una zona de la mama se pone roja y le causa dolor, y además usted tiene fiebre. °? Una pantorrilla enrojecida y con dolor. °? Repentino e intenso dolor en el pecho. °? Falta de aire. °? Micción dolorosa o con sangre. °? Problemas visuales. °· Vómitos durante 12 horas o más. °· Dolor de cabeza intenso. °· Tiene pensamientos serios acerca de lastimarse a usted misma o dañar al niño o a otra persona. ° °Esta información no tiene como fin reemplazar el consejo del médico. Asegúrese de hacerle al médico cualquier pregunta que  tenga. °Document Released: 07/28/2005 Document Revised: 11/19/2015 Document Reviewed: 01/29/2015 °Elsevier Interactive Patient Education © 2017 Elsevier Inc. ° °

## 2016-12-29 ENCOUNTER — Encounter: Payer: Self-pay | Admitting: Obstetrics and Gynecology

## 2017-01-01 ENCOUNTER — Encounter: Payer: Self-pay | Admitting: Women's Health

## 2017-01-19 ENCOUNTER — Ambulatory Visit: Payer: Self-pay | Admitting: Women's Health

## 2017-01-26 ENCOUNTER — Ambulatory Visit (INDEPENDENT_AMBULATORY_CARE_PROVIDER_SITE_OTHER): Payer: Self-pay | Admitting: Women's Health

## 2017-01-26 ENCOUNTER — Encounter (INDEPENDENT_AMBULATORY_CARE_PROVIDER_SITE_OTHER): Payer: Self-pay

## 2017-01-26 ENCOUNTER — Encounter: Payer: Self-pay | Admitting: Women's Health

## 2017-01-26 DIAGNOSIS — N6009 Solitary cyst of unspecified breast: Secondary | ICD-10-CM

## 2017-01-26 DIAGNOSIS — N63 Unspecified lump in unspecified breast: Secondary | ICD-10-CM

## 2017-01-26 DIAGNOSIS — D249 Benign neoplasm of unspecified breast: Secondary | ICD-10-CM

## 2017-01-26 DIAGNOSIS — R590 Localized enlarged lymph nodes: Secondary | ICD-10-CM

## 2017-01-26 NOTE — Progress Notes (Signed)
Subjective:    Morgan Campos is a 31 y.o. [id number] Hispanic female who presents for a postpartum visit. She is 4 weeks postpartum following a spontaneous vaginal delivery at 38.2 gestational weeks. Anesthesia: none. I have fully reviewed the prenatal and intrapartum course. Postpartum course has been uncomplicated. Baby's course has been uncomplicated. Baby is feeding by breast and bottle. Bleeding no bleeding. Bowel function is some constipation. Bladder function is normal. Patient is not sexually active. Last sexual activity: prior to birth of baby. Contraception method is plans depo @ [facility]. Postpartum depression screening: negative. Score 2.  Last pap 2017 @ [facility] and was neg. Noticed bump under Lt axilla x ~ 4wks. 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   The following portions of the patient's history were reviewed and updated as appropriate: allergies, current medications, past medical history, past surgical history and problem list.  Review of Systems Pertinent items are noted in HPI.   Vitals:   01/26/17 1119  BP: 110/68  Pulse: 62  Weight: 130 lb (59 kg)  Height: 5' 2.5" (1.588 m)  Temp: 98.0 No LMP recorded.  Objective:   General:  alert, cooperative and no distress   Breasts:  Rt: small firm mass 11o'clock~72fb from nipple c/w prev fibroadenoma Lt: small firm mass 12 o'clock areolar area c/w prev fibroadenoma; ~2cm firm mobile slightly tender mass 3 o'clock ~3-47fb from nipple Lt: small firm non-tender lymph node Lt mid axilla No erythema/streaks, s/s mastitis  Lungs: clear to auscultation bilaterally  Heart:  regular rate and rhythm  Abdomen: soft, nontender   Vulva: normal  Vagina: normal vagina  Cervix:  closed  Corpus: Well-involuted  Adnexa:  Non-palpable  Rectal Exam: No hemorrhoids        Assessment:   Postpartum exam 4 wks s/p SVB   Lt axillary lymph node H/O fibroadenomas and breast cyst Multiple breast masses Breast and bottlefeeding Depression screening Contraception counseling   Plan:  Contraception: abstinence unti depo at [facility]  Scheduled bilateral breast u/s at [facility] 6/26 @ 3:45pm, be there @ 3:40pm, no lotion/powder/deoderant/perfume Follow up in: as needed, f/u w/ [facility] for depo as planned and pap/physicals Spanish interpreter used for all communications   Tawnya Crook CNM, [facility] 01/26/2017 11:30 AM

## 2017-01-26 NOTE — Patient Instructions (Signed)
Tues June 26 at 3:40pm at [facility]. NO lotion/powder/deoderant/perfume  Go to health department to get depo shot

## 2017-02-03 ENCOUNTER — Other Ambulatory Visit (HOSPITAL_COMMUNITY): Payer: PRIVATE HEALTH INSURANCE

## 2017-02-03 ENCOUNTER — Ambulatory Visit (HOSPITAL_COMMUNITY): Admission: RE | Admit: 2017-02-03 | Payer: PRIVATE HEALTH INSURANCE | Source: Ambulatory Visit

## 2017-02-12 ENCOUNTER — Other Ambulatory Visit (HOSPITAL_COMMUNITY): Payer: Self-pay | Admitting: *Deleted

## 2017-02-12 DIAGNOSIS — Z09 Encounter for follow-up examination after completed treatment for conditions other than malignant neoplasm: Secondary | ICD-10-CM

## 2017-02-12 DIAGNOSIS — N63 Unspecified lump in unspecified breast: Secondary | ICD-10-CM

## 2017-03-03 ENCOUNTER — Ambulatory Visit (HOSPITAL_COMMUNITY)
Admission: RE | Admit: 2017-03-03 | Discharge: 2017-03-03 | Disposition: A | Discharge status: Home / Self Care | Payer: PRIVATE HEALTH INSURANCE | Source: Ambulatory Visit | Attending: *Deleted | Admitting: *Deleted

## 2017-03-03 ENCOUNTER — Other Ambulatory Visit (HOSPITAL_COMMUNITY): Payer: PRIVATE HEALTH INSURANCE

## 2017-03-03 DIAGNOSIS — N6489 Other specified disorders of breast: Secondary | ICD-10-CM | POA: Insufficient documentation

## 2017-03-03 DIAGNOSIS — N63 Unspecified lump in unspecified breast: Secondary | ICD-10-CM

## 2017-03-03 DIAGNOSIS — Z09 Encounter for follow-up examination after completed treatment for conditions other than malignant neoplasm: Secondary | ICD-10-CM

## 2017-03-10 ENCOUNTER — Other Ambulatory Visit (HOSPITAL_COMMUNITY): Payer: Self-pay | Admitting: *Deleted

## 2017-03-10 DIAGNOSIS — R229 Localized swelling, mass and lump, unspecified: Principal | ICD-10-CM

## 2017-03-10 DIAGNOSIS — IMO0002 Reserved for concepts with insufficient information to code with codable children: Secondary | ICD-10-CM

## 2017-03-17 ENCOUNTER — Encounter (HOSPITAL_COMMUNITY): Payer: Self-pay

## 2017-03-17 ENCOUNTER — Ambulatory Visit (HOSPITAL_COMMUNITY)
Admission: RE | Admit: 2017-03-17 | Discharge: 2017-03-17 | Disposition: A | Discharge status: Home / Self Care | Payer: PRIVATE HEALTH INSURANCE | Source: Ambulatory Visit | Attending: *Deleted | Admitting: *Deleted

## 2017-09-06 IMAGING — US US BREAST*R* LIMITED INC AXILLA
1 series · 13 of 15 positions shown · non-contrast
Comparison: 09/30/2016 and 03/18/2016

ADDENDUM:
It was brought to our attention that the report did not include the
findings of the left axilla where the patient feels a pea-sized
lump.

No suspicious mass was palpated in the left axilla on physical exam.
Images from the ultrasound of 03/03/2017 are reviewed and an
ultrasound was performed of the left axilla in the region of patient
concern. Only normal subcutaneous tissues are seen. No
lymphadenopathy, mass, or fluid collection is identified in the left
axilla to explain the patient's lump.
CLINICAL DATA: 30 year old patient presents for follow-up of
probably benign fibroadenomas bilaterally. A Spanish interpreter was
present throughout the exam.
EXAM:
ULTRASOUND OF THE BILATERAL BREAST

[Series 1: us breast*right* limited inc axilla · 0.05mm/px · 13 of 15 slices shown]
[im 1/15]
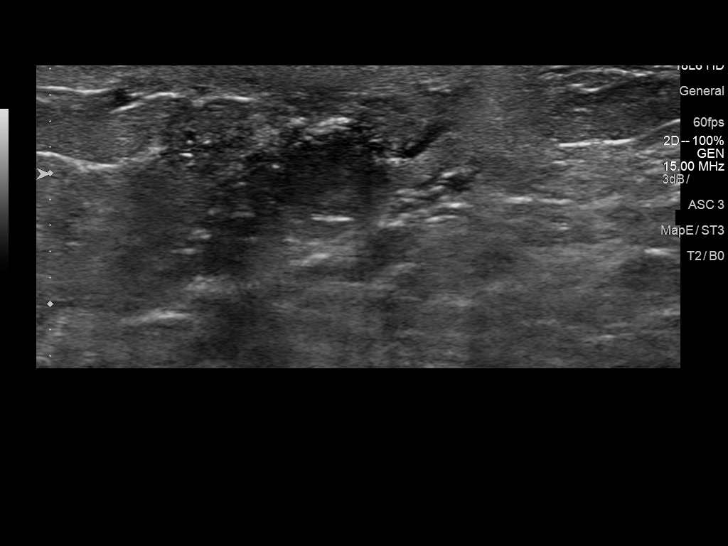
[im 2/15]
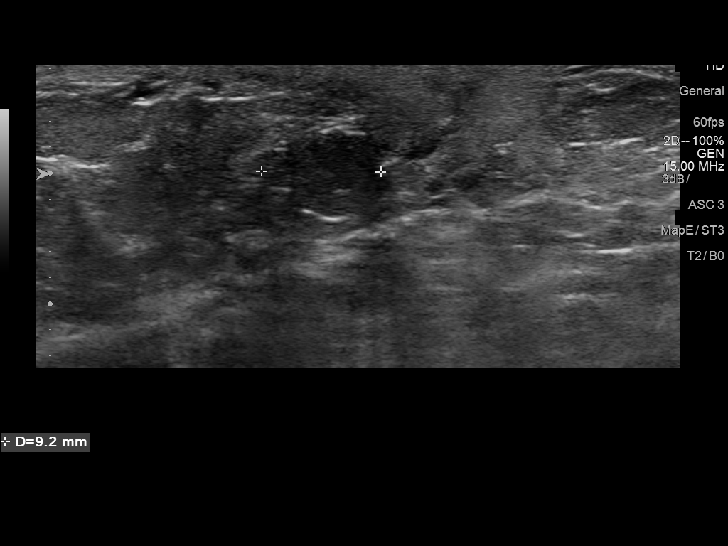
[im 3/15]
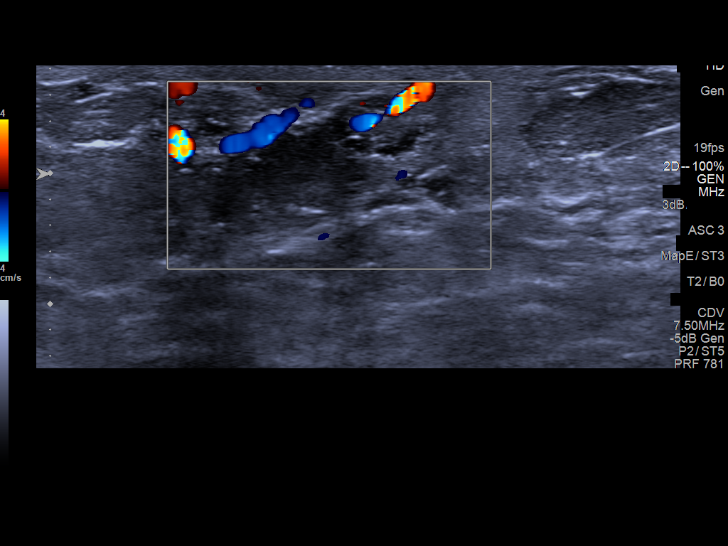
[im 5/15]
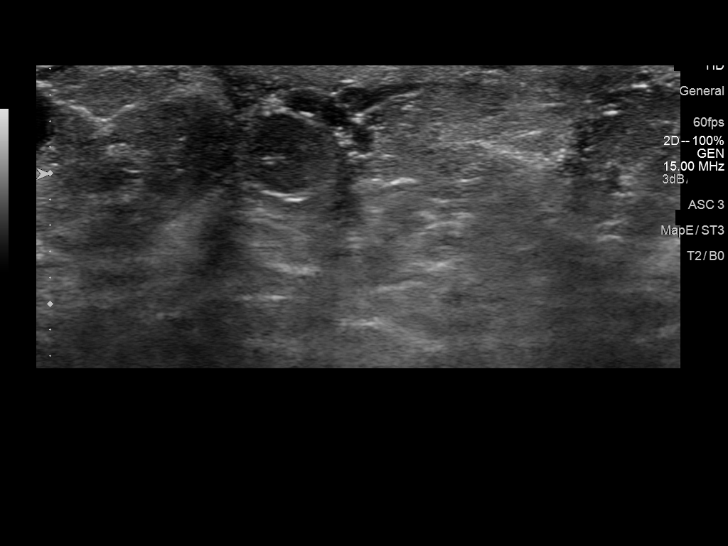
[im 6/15]
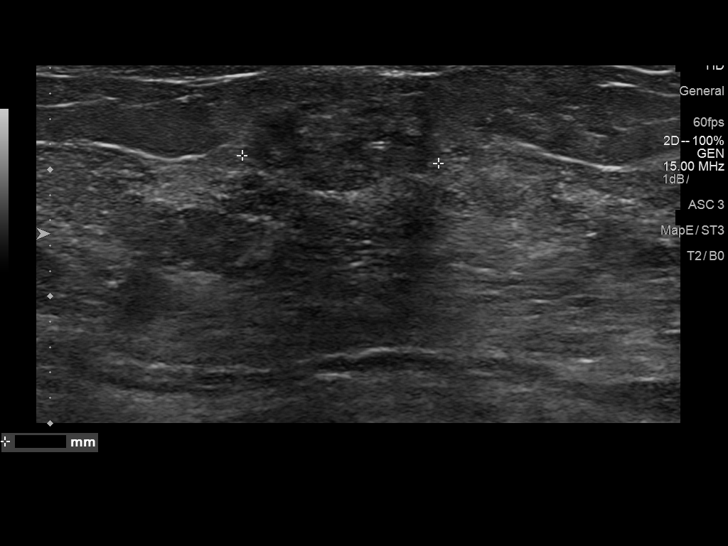
[im 7/15]
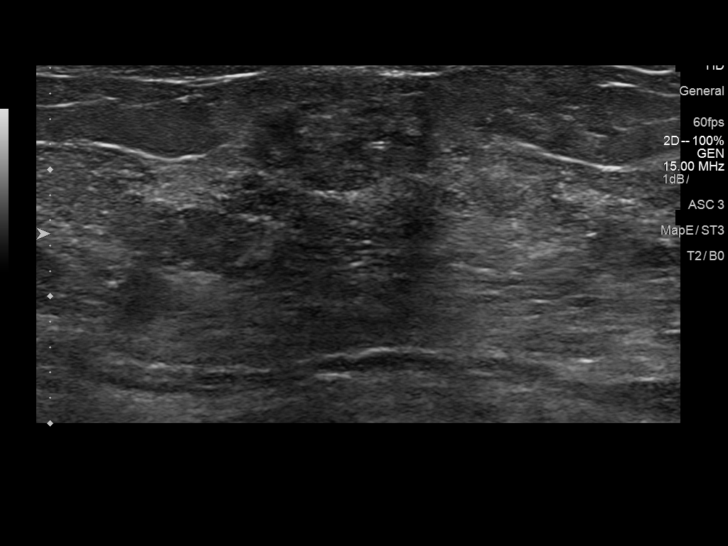
[im 8/15]
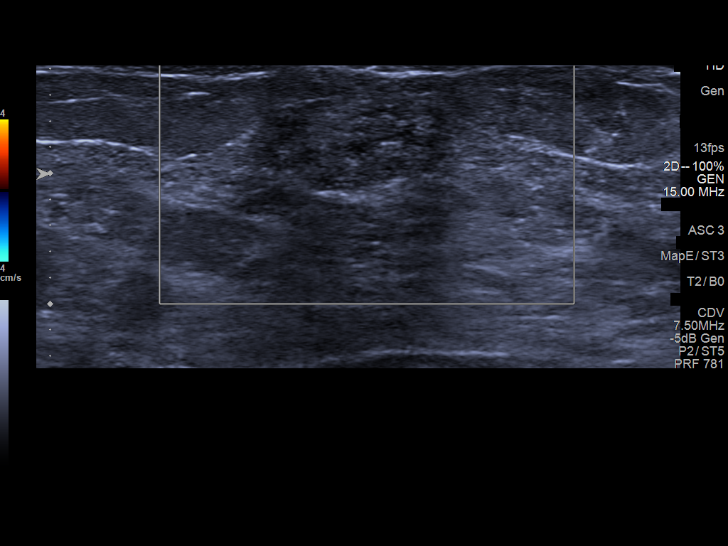
[im 9/15]
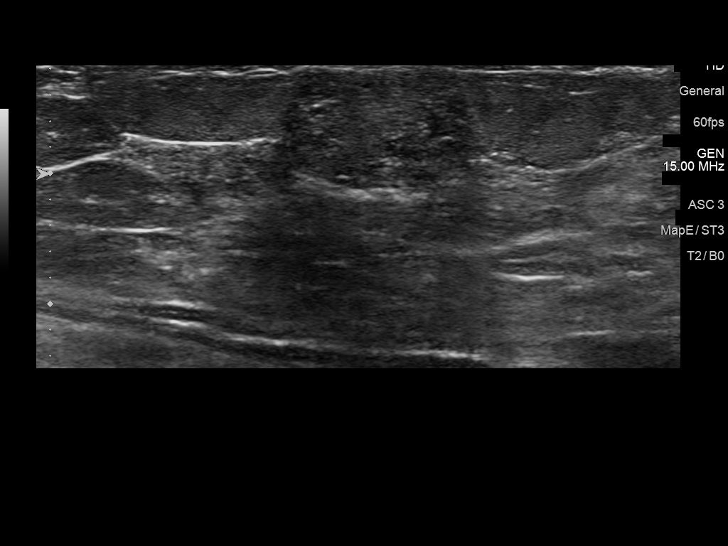
[im 10/15]
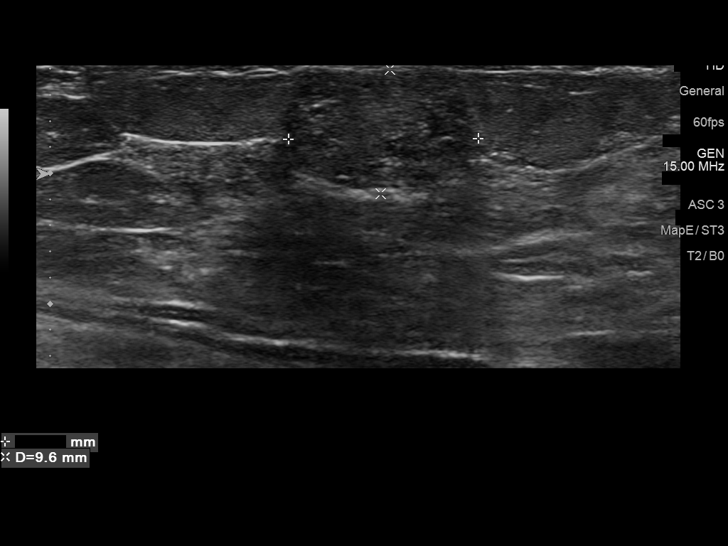
[im 11/15]
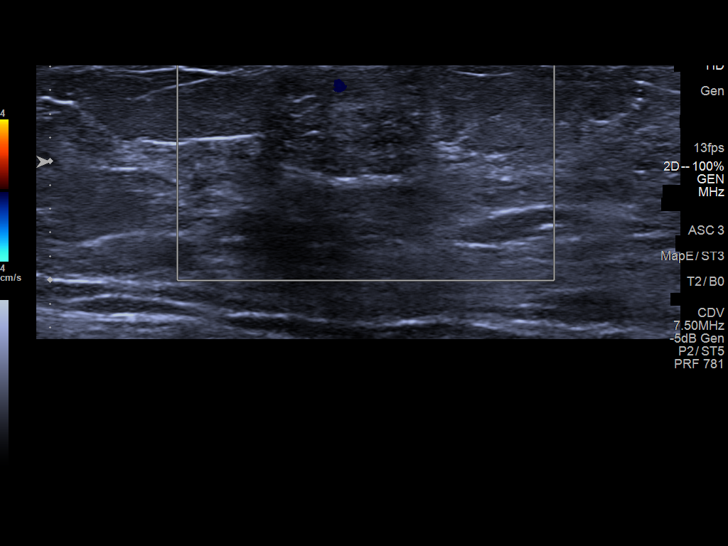
[im 13/15]
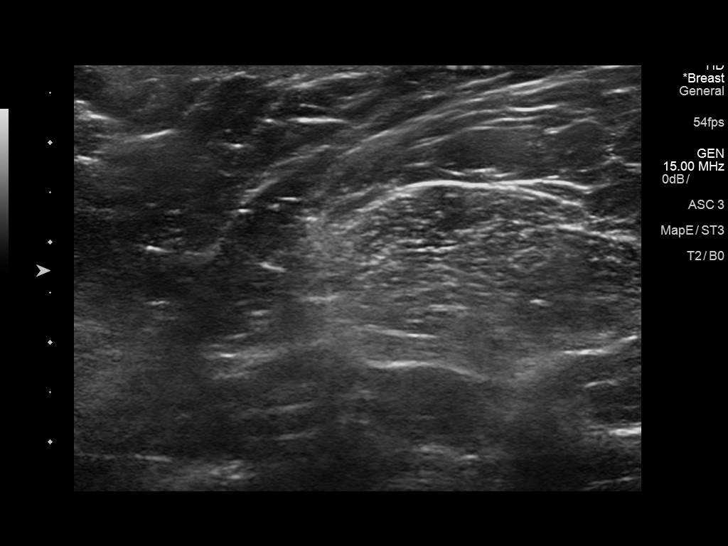
[im 14/15]
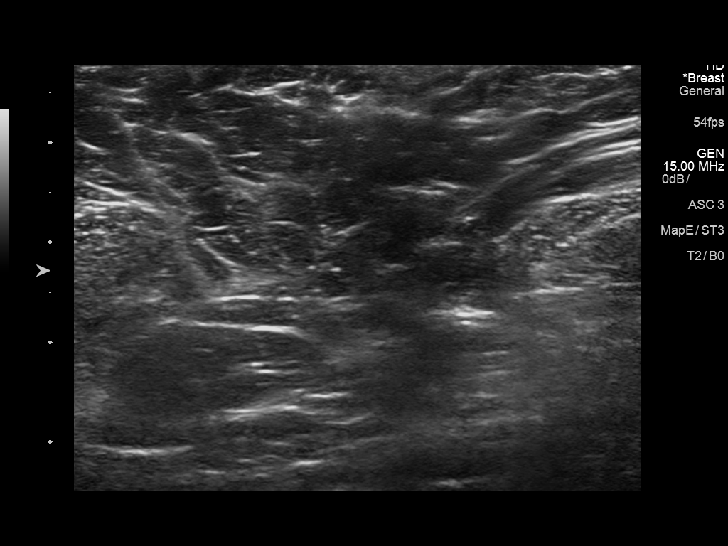
[im 15/15]
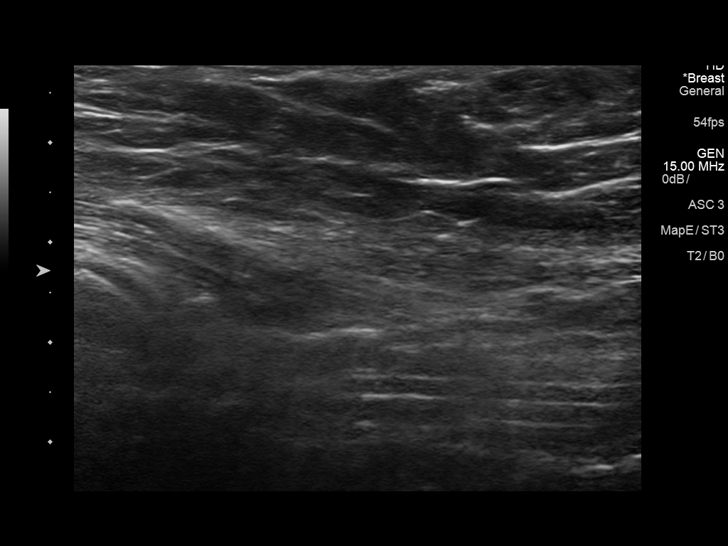

[13 of 15 positions shown; findings below may reference images not displayed]

FINDINGS: Targeted ultrasound is performed, showing a circumscribed slightly
hypoechoic oval mass at 11 o'clock position left breast 2 cm from
the nipple that is oriented parallel to the chest wall. This mass
measures 1.5 x 1.0 x 1.6 cm. This mass is stable in size and has
imaging features compatible with a benign fibroadenoma.

In the right breast at 12 o'clock position retroareolar is an oval
gently lobulated circumscribed hypoechoic mass measuring 0.7 x 0.6 x
0.9 cm. This mass has imaging features compatible with a benign
fibroadenoma and is stable in size.
IMPRESSION: One year stability of bilateral probable fibroadenomas.

RECOMMENDATION:
Bilateral breast ultrasound is recommended in March 2018 to
complete a full 2 year follow-up. The patient's questions were
answered. The options of ultrasound-guided biopsy or surgical
excision were also discussed with the patient. She is in agreement
with imaging follow-up in 1 year.

I have discussed the findings and recommendations with the patient.
Results were also provided in writing at the conclusion of the
visit. If applicable, a reminder letter will be sent to the patient
regarding the next appointment.

BI-RADS CATEGORY  3: Probably benign.

## 2018-03-18 ENCOUNTER — Other Ambulatory Visit (HOSPITAL_COMMUNITY): Payer: Self-pay | Admitting: *Deleted

## 2018-03-18 DIAGNOSIS — IMO0002 Reserved for concepts with insufficient information to code with codable children: Secondary | ICD-10-CM

## 2018-03-18 DIAGNOSIS — R229 Localized swelling, mass and lump, unspecified: Principal | ICD-10-CM

## 2018-04-05 ENCOUNTER — Other Ambulatory Visit (HOSPITAL_COMMUNITY): Payer: Self-pay | Admitting: *Deleted

## 2018-04-05 DIAGNOSIS — IMO0002 Reserved for concepts with insufficient information to code with codable children: Secondary | ICD-10-CM

## 2018-04-05 DIAGNOSIS — R229 Localized swelling, mass and lump, unspecified: Principal | ICD-10-CM

## 2018-04-06 ENCOUNTER — Ambulatory Visit (HOSPITAL_COMMUNITY): Payer: PRIVATE HEALTH INSURANCE

## 2018-04-06 ENCOUNTER — Ambulatory Visit (HOSPITAL_COMMUNITY)
Admission: RE | Admit: 2018-04-06 | Discharge: 2018-04-06 | Disposition: A | Discharge status: Home / Self Care | Payer: PRIVATE HEALTH INSURANCE | Source: Ambulatory Visit | Attending: *Deleted | Admitting: *Deleted

## 2018-04-06 DIAGNOSIS — IMO0002 Reserved for concepts with insufficient information to code with codable children: Secondary | ICD-10-CM

## 2018-04-06 DIAGNOSIS — R928 Other abnormal and inconclusive findings on diagnostic imaging of breast: Secondary | ICD-10-CM | POA: Insufficient documentation

## 2018-04-06 DIAGNOSIS — R229 Localized swelling, mass and lump, unspecified: Secondary | ICD-10-CM

## 2018-10-10 IMAGING — US US BREAST*L* LIMITED INC AXILLA
1 series · 13 of 15 positions shown · non-contrast
Comparison: Previous exam(s).

CLINICAL DATA: Follow-up for probably benign masses within each
breast.

EXAM:
ULTRASOUND OF THE BILATERAL BREAST

[Series 1: us breast*left* limited inc axilla · 0.06mm/px · 13 of 15 slices shown]
[im 1/15]
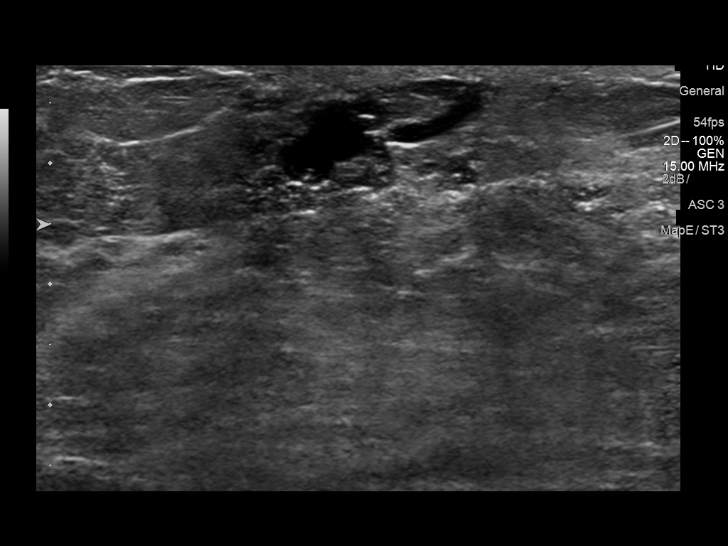
[im 2/15]
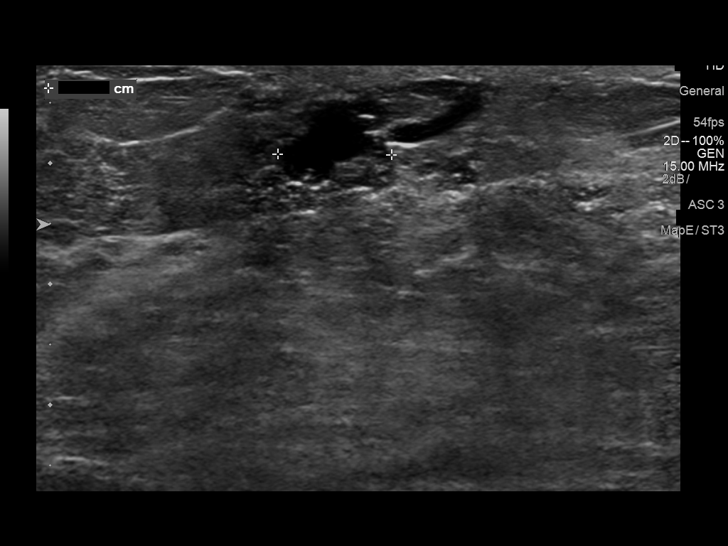
[im 3/15]
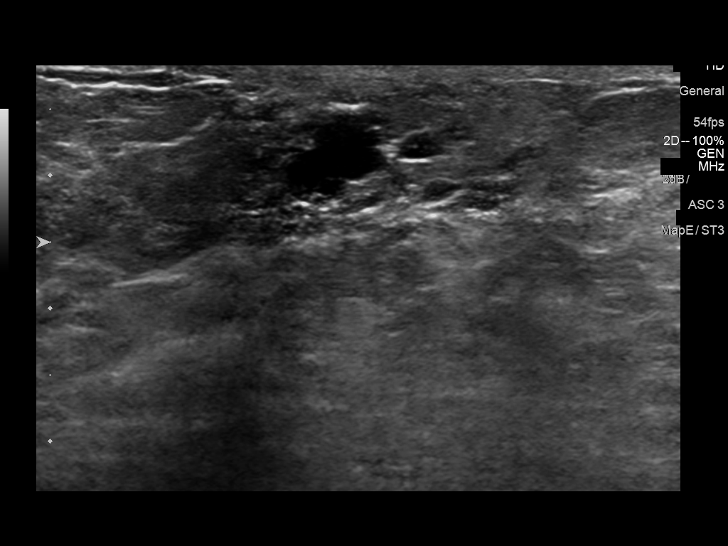
[im 5/15]
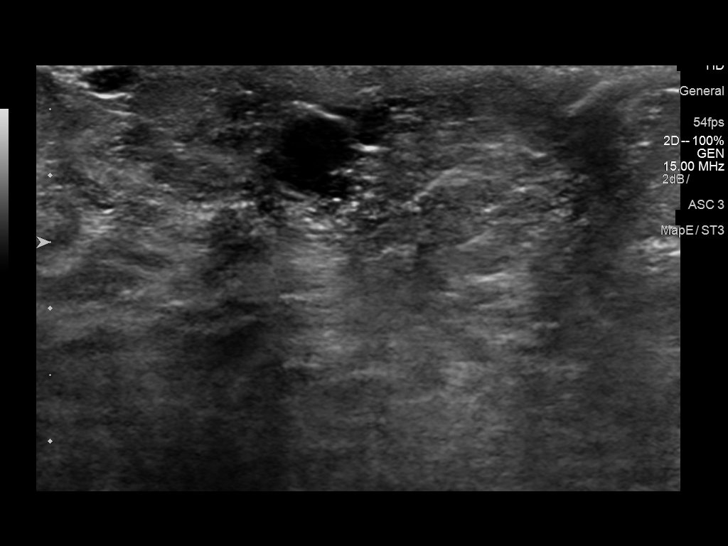
[im 6/15]
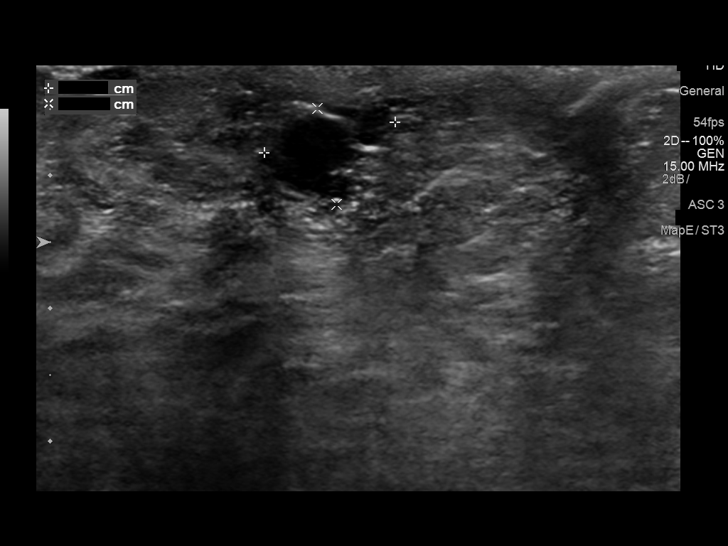
[im 7/15]
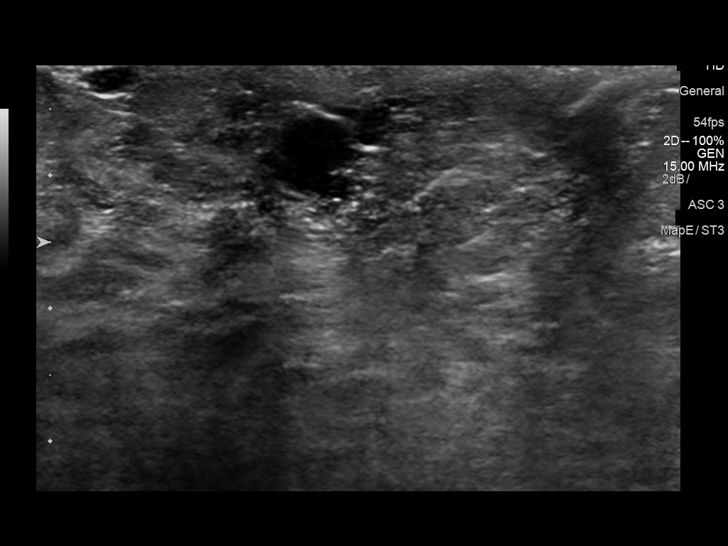
[im 8/15]
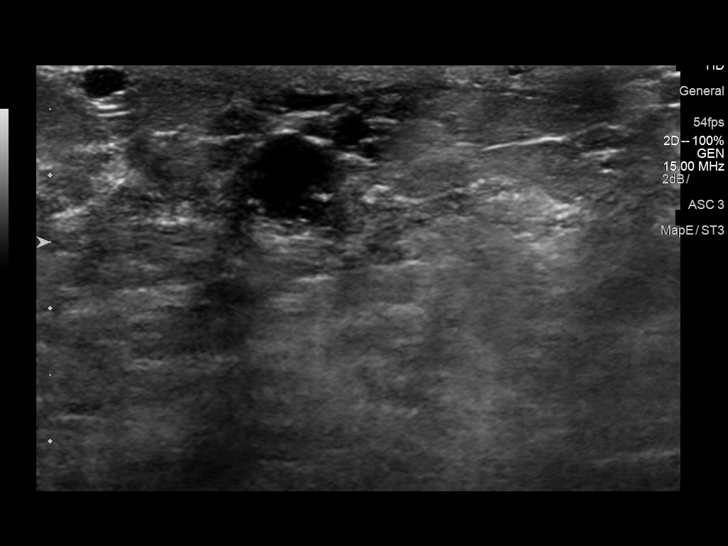
[im 9/15]
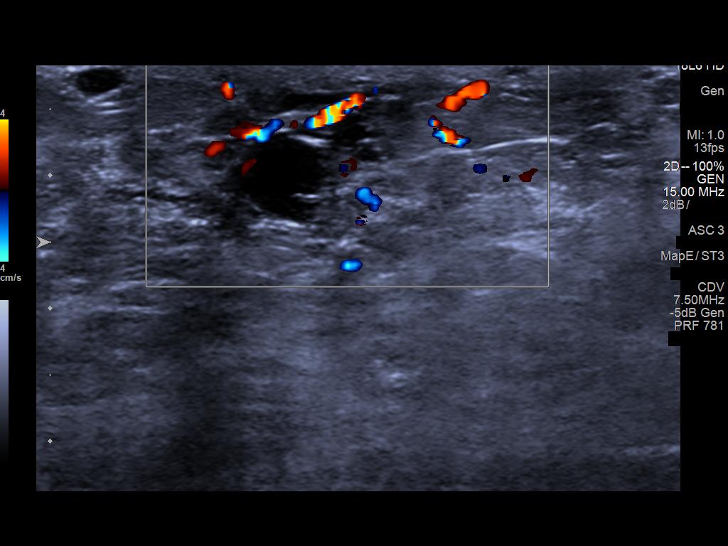
[im 10/15]
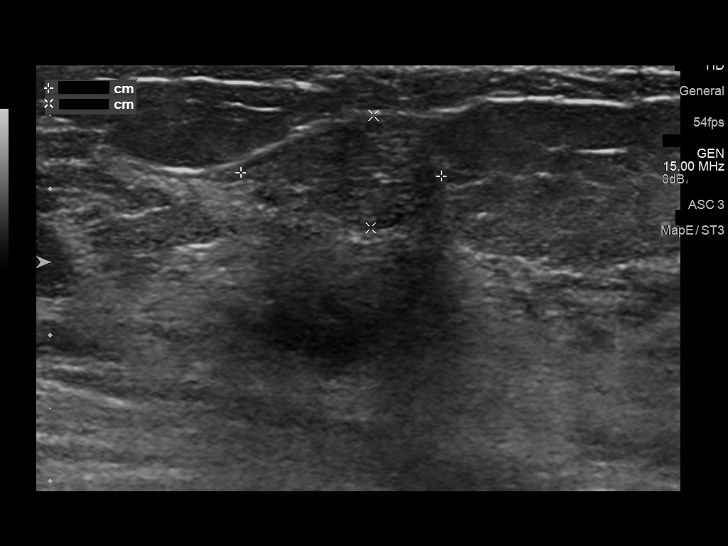
[im 11/15]
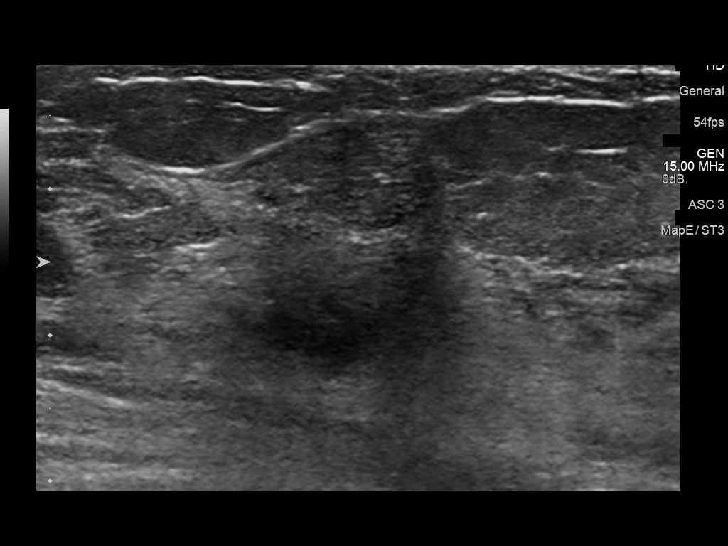
[im 13/15]
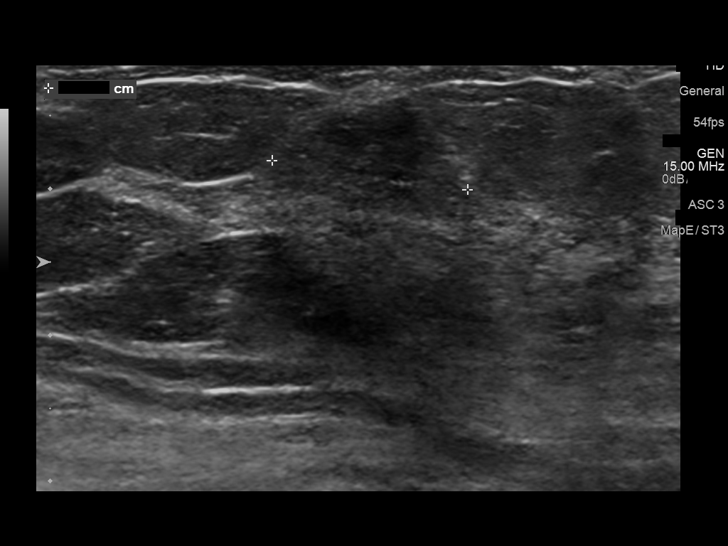
[im 14/15]
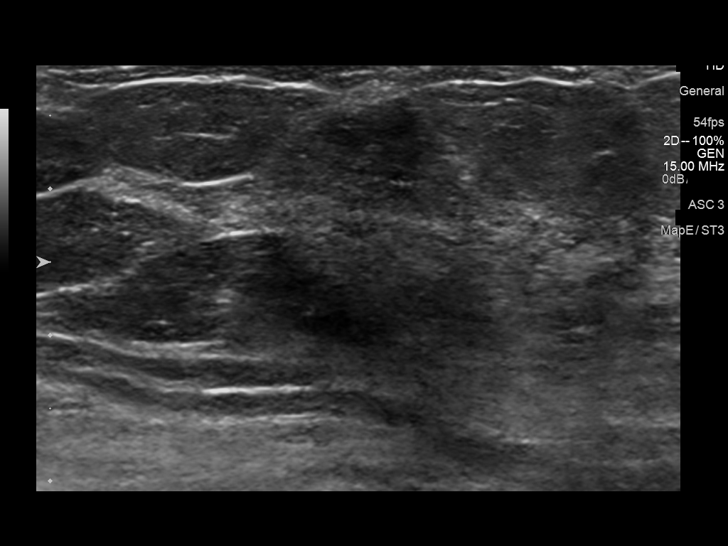
[im 15/15]
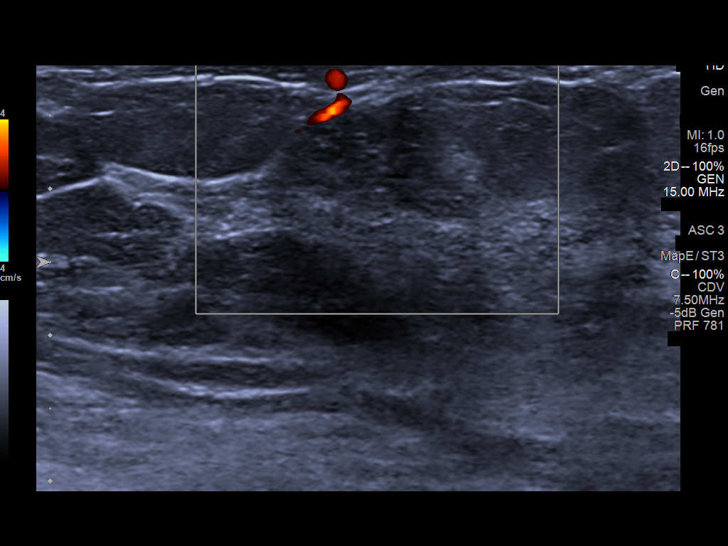

[13 of 15 positions shown; findings below may reference images not displayed]

FINDINGS: RIGHT breast: Targeted ultrasound is performed, again showing a
cystic mass within the subareolar RIGHT breast, measuring 1 x 0.7 x
0.9 cm, more simple in appearance today and not significantly
changed in size for greater than 2 years confirming benignity.

LEFT breast: Targeted ultrasound is performed, again showing a
hypoechoic mass in the LEFT breast at the 11 o'clock axis, 2 cm from
the nipple, measuring 1.4 x 0.8 x 1.4 cm, not significantly changed
in size for greater than 2 years confirming benignity.
IMPRESSION: No evidence of malignancy within either breast. Stable benign
findings within each breast, as detailed above.

RECOMMENDATION:
Screening mammogram at age 40 unless there are persistent or
intervening clinical concerns. (Code:[id number])

I have discussed the findings and recommendations with the patient.
Results were also provided in writing at the conclusion of the
visit. If applicable, a reminder letter will be sent to the patient
regarding the next appointment.

BI-RADS CATEGORY  2: Benign.

## 2024-05-18 ENCOUNTER — Other Ambulatory Visit: Payer: Self-pay

## 2024-05-18 ENCOUNTER — Emergency Department (HOSPITAL_COMMUNITY)
Admission: EM | Admit: 2024-05-18 | Discharge: 2024-05-19 | Disposition: A | Discharge status: Home / Self Care | Payer: Self-pay | Attending: Emergency Medicine | Admitting: Emergency Medicine

## 2024-05-18 ENCOUNTER — Encounter (HOSPITAL_COMMUNITY): Payer: Self-pay

## 2024-05-18 ENCOUNTER — Emergency Department (HOSPITAL_COMMUNITY): Payer: Self-pay

## 2024-05-18 DIAGNOSIS — R109 Unspecified abdominal pain: Secondary | ICD-10-CM | POA: Diagnosis not present

## 2024-05-18 DIAGNOSIS — M546 Pain in thoracic spine: Secondary | ICD-10-CM | POA: Insufficient documentation

## 2024-05-18 DIAGNOSIS — M549 Dorsalgia, unspecified: Secondary | ICD-10-CM | POA: Diagnosis present

## 2024-05-18 LAB — CBC WITH DIFFERENTIAL/PLATELET
Abs Immature Granulocytes: 0.03 K/uL (ref 0.00–0.07)
Basophils Absolute: 0 K/uL (ref 0.0–0.1)
Basophils Relative: 0 %
Eosinophils Absolute: 0.1 K/uL (ref 0.0–0.5)
Eosinophils Relative: 1 %
HCT: 41.9 % (ref 36.0–46.0)
Hemoglobin: 13.7 g/dL (ref 12.0–15.0)
Immature Granulocytes: 0 %
Lymphocytes Relative: 31 %
Lymphs Abs: 2.6 K/uL (ref 0.7–4.0)
MCH: 29.6 pg (ref 26.0–34.0)
MCHC: 32.7 g/dL (ref 30.0–36.0)
MCV: 90.5 fL (ref 80.0–100.0)
Monocytes Absolute: 0.5 K/uL (ref 0.1–1.0)
Monocytes Relative: 6 %
Neutro Abs: 5.2 K/uL (ref 1.7–7.7)
Neutrophils Relative %: 62 %
Platelets: 288 K/uL (ref 150–400)
RBC: 4.63 MIL/uL (ref 3.87–5.11)
RDW: 12.8 % (ref 11.5–15.5)
WBC: 8.5 K/uL (ref 4.0–10.5)
nRBC: 0 % (ref 0.0–0.2)

## 2024-05-18 LAB — COMPREHENSIVE METABOLIC PANEL WITH GFR
ALT: 96 U/L — ABNORMAL HIGH (ref 0–44)
AST: 199 U/L — ABNORMAL HIGH (ref 15–41)
Albumin: 4.4 g/dL (ref 3.5–5.0)
Alkaline Phosphatase: 111 U/L (ref 38–126)
Anion gap: 12 (ref 5–15)
BUN: 21 mg/dL — ABNORMAL HIGH (ref 6–20)
CO2: 23 mmol/L (ref 22–32)
Calcium: 9.5 mg/dL (ref 8.9–10.3)
Chloride: 103 mmol/L (ref 98–111)
Creatinine, Ser: 0.59 mg/dL (ref 0.44–1.00)
GFR, Estimated: >60 mL/min (ref >60)
Glucose, Bld: 107 mg/dL — ABNORMAL HIGH (ref 70–99)
Potassium: 3.6 mmol/L (ref 3.5–5.1)
Sodium: 138 mmol/L (ref 135–145)
Total Bilirubin: 0.5 mg/dL (ref 0.0–1.2)
Total Protein: 7.1 g/dL (ref 6.5–8.1)

## 2024-05-18 LAB — LIPASE, BLOOD: Lipase: 39 U/L (ref 11–51)

## 2024-05-18 LAB — HCG, SERUM, QUALITATIVE: Preg, Serum: NEGATIVE

## 2024-05-18 MED ORDER — MORPHINE SULFATE (PF) 4 MG/ML IV SOLN
4.0000 mg | Freq: Once | INTRAVENOUS | Status: AC
  Administered 2024-05-18: 4 mg via INTRAVENOUS
  Filled 2024-05-18: qty 1

## 2024-05-18 MED ORDER — ONDANSETRON HCL 4 MG/2ML IJ SOLN
4.0000 mg | Freq: Once | INTRAMUSCULAR | Status: AC
  Administered 2024-05-18: 4 mg via INTRAVENOUS
  Filled 2024-05-18: qty 2

## 2024-05-18 MED ORDER — IOHEXOL 300 MG/ML  SOLN
100.0000 mL | Freq: Once | INTRAMUSCULAR | Status: AC | PRN
  Administered 2024-05-18: 100 mL via INTRAVENOUS

## 2024-05-18 NOTE — ED Provider Notes (Signed)
   [place] EMERGENCY DEPARTMENT AT [facility]  Provider Note  CSN: [id number] Arrival date & time: 05/18/24 2144  History Chief Complaint  Patient presents with   Abdominal Pain   History via video interpreter Morgan Campos is a 38 y.o. female with no significant PMH reports sudden onset of mid back pain, radiating into abdomen around 2100hrs shortly after eating dinner. No vomiting. No diarrhea, constipation, dysuria or hematuria. Pain has since subsided some but not resolved. She had one similar episode about a year ago but did not seek care then.    Home Medications Prior to Admission medications   Medication Sig Start Date End Date Taking? Authorizing Provider  Prenatal Vit-Fe Fumarate-FA (PRENATAL MULTIVITAMIN) TABS Take 1 tablet by mouth daily.    [provider]     Allergies    Patient has no known allergies.   Review of Systems   Review of Systems Please see HPI for pertinent positives and negatives  Physical Exam BP 139/83 (BP Location: Right Arm)   Pulse 70   Resp 18   Wt 59 kg   SpO2 100%   BMI 23.41 kg/m   Physical Exam Vitals and nursing note reviewed.  Constitutional:      Appearance: Normal appearance.  HENT:     Head: Normocephalic and atraumatic.     Nose: Nose normal.     Mouth/Throat:     Mouth: Mucous membranes are moist.  Eyes:     Extraocular Movements: Extraocular movements intact.     Conjunctiva/sclera: Conjunctivae normal.  Cardiovascular:     Rate and Rhythm: Normal rate.  Pulmonary:     Effort: Pulmonary effort is normal.     Breath sounds: Normal breath sounds.  Abdominal:     General: Abdomen is flat.     Palpations: Abdomen is soft.     Tenderness: There is no abdominal tenderness. There is no guarding. Negative signs include Murphy's sign and McBurney's sign.  Musculoskeletal:        General: No swelling. Normal range of motion.     Cervical back: Neck supple.  Skin:    General: Skin is  warm and dry.  Neurological:     General: No focal deficit present.     Mental Status: She is alert.  Psychiatric:        Mood and Affect: Mood normal.     ED Results / Procedures / Treatments   EKG None  Procedures Procedures  Medications Ordered in the ED Medications - No data to display  Initial Impression and Plan  Patient here with mid back and epigastric pain, abdomen is not tender at this time. Consider gastritis, PUD, biliary disease, renal colic, MSK. Labs done in triage show normal CBC, CMP with mildly elevated LFTs, normal Lipase. HCG is neg. Will send for CT. Pain/nausea meds for comfort.   ED Course       MDM Rules/Calculators/A&P Medical Decision Making Amount and/or Complexity of Data Reviewed Radiology: ordered.  Risk Prescription drug management.     Final Clinical Impression(s) / ED Diagnoses Final diagnoses:  None    Rx / DC Orders ED Discharge Orders     None

## 2024-05-18 NOTE — ED Triage Notes (Signed)
 Pt reports pain radiating from her back into her stomach.

## 2024-05-19 LAB — URINALYSIS, ROUTINE W REFLEX MICROSCOPIC
Bilirubin Urine: NEGATIVE
Glucose, UA: NEGATIVE mg/dL
Hgb urine dipstick: NEGATIVE
Ketones, ur: NEGATIVE mg/dL
Leukocytes,Ua: NEGATIVE
Nitrite: NEGATIVE
Protein, ur: NEGATIVE mg/dL
Specific Gravity, Urine: 1.015 (ref 1.005–1.030)
pH: 7 (ref 5.0–8.0)

## 2024-05-19 MED ORDER — KETOROLAC TROMETHAMINE 30 MG/ML IJ SOLN
15.0000 mg | Freq: Once | INTRAMUSCULAR | Status: AC
  Administered 2024-05-19: 15 mg via INTRAVENOUS
  Filled 2024-05-19: qty 1

## 2024-05-19 MED ORDER — HYDROCODONE-ACETAMINOPHEN 5-325 MG PO TABS: 1.0000 | ORAL_TABLET | Freq: Four times a day (QID) | ORAL | 0 refills | Status: AC | PRN
